# Patient Record
Sex: Male | Born: 1985 | Race: Black or African American | Hispanic: No | Marital: Single | Smoking: Current every day smoker
Health system: Southern US, Community
[De-identification: ages and names within clinical notes are randomized; demographics above are authoritative.]

## PROBLEM LIST (undated history)

## (undated) DIAGNOSIS — F39 Unspecified mood [affective] disorder: Secondary | ICD-10-CM

## (undated) HISTORY — DX: Unspecified mood (affective) disorder: F39

## (undated) HISTORY — PX: TYMPANOSTOMY TUBE PLACEMENT: SHX32

---
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
## 2006-01-12 ENCOUNTER — Emergency Department (HOSPITAL_COMMUNITY): Admission: EM | Admit: 2006-01-12 | Discharge: 2006-01-12 | Payer: Self-pay | Admitting: Emergency Medicine

## 2006-08-11 ENCOUNTER — Emergency Department (HOSPITAL_COMMUNITY): Admission: EM | Admit: 2006-08-11 | Discharge: 2006-08-11 | Payer: Self-pay | Admitting: Emergency Medicine

## 2015-09-17 ENCOUNTER — Other Ambulatory Visit: Payer: Self-pay | Admitting: Physician Assistant

## 2015-09-17 MED ORDER — CLINDAMYCIN HCL 300 MG PO CAPS
300.0000 mg | ORAL_CAPSULE | Freq: Three times a day (TID) | ORAL | Status: AC
Start: 2015-09-17 — End: 2015-09-23

## 2016-05-27 ENCOUNTER — Ambulatory Visit: Payer: Self-pay | Admitting: Physician Assistant

## 2016-06-02 ENCOUNTER — Encounter: Payer: Self-pay | Admitting: Physician Assistant

## 2016-09-23 ENCOUNTER — Ambulatory Visit: Payer: Self-pay | Admitting: Physician Assistant

## 2016-09-23 ENCOUNTER — Encounter: Payer: Self-pay | Admitting: Physician Assistant

## 2016-09-23 VITALS — BP 122/72 | HR 84 | Temp 97.9°F | Ht 70.5 in | Wt 251.2 lb

## 2016-09-23 DIAGNOSIS — B353 Tinea pedis: Secondary | ICD-10-CM

## 2016-09-23 DIAGNOSIS — M79645 Pain in left finger(s): Secondary | ICD-10-CM

## 2016-09-23 DIAGNOSIS — Z131 Encounter for screening for diabetes mellitus: Secondary | ICD-10-CM

## 2016-09-23 DIAGNOSIS — F17210 Nicotine dependence, cigarettes, uncomplicated: Secondary | ICD-10-CM | POA: Insufficient documentation

## 2016-09-23 DIAGNOSIS — R21 Rash and other nonspecific skin eruption: Secondary | ICD-10-CM

## 2016-09-23 LAB — GLUCOSE, POCT (MANUAL RESULT ENTRY): POC Glucose: 86 mg/dl (ref 70–99)

## 2016-09-23 MED ORDER — TRIAMCINOLONE ACETONIDE 0.1 % EX CREA: 1.0000 "application " | TOPICAL_CREAM | Freq: Two times a day (BID) | CUTANEOUS | 0 refills | Status: DC

## 2016-09-23 MED ORDER — NYSTATIN 100000 UNIT/GM EX CREA: 1.0000 "application " | TOPICAL_CREAM | Freq: Two times a day (BID) | CUTANEOUS | 0 refills | Status: DC

## 2016-09-23 NOTE — Progress Notes (Signed)
BP 122/72 (BP Location: Left Arm, Patient Position: Sitting, Cuff Size: Normal)   Pulse 84   Temp 97.9 F (36.6 C)   Ht 5' 10.5" (1.791 m)   Wt 251 lb 4 oz (114 kg)   SpO2 97%   BMI 35.54 kg/m    Subjective:    Patient ID: Scott Atkins, male    DOB: 06-20-1986, 31 y.o.   MRN: [id number]  HPI: Scott Atkins is a 31 y.o. male presenting on 09/23/2016 for New Patient (Initial Visit) (returning patient. pt is currently being seen at [facility]. Pt was previously being seen with a doctor in [place] pt is unsure of the name of the practice.)   HPI Chief Complaint  Patient presents with  . New Patient (Initial Visit)    returning patient. pt is currently being seen at [facility]. Pt was previously being seen with a doctor in [place] pt is unsure of the name of the practice.    Pt was Last seen here 05/29/2015  Pt is still going to [facility] for MH   Pt c/o rash on back.  C/o L index finger swells.  Pt is right hand dominant.  Also pt c/o both feet swelling  Rash for about 2 wk.  Says it itches.  He used some cream on it that helps with the itching.  Says rash limited to B flank  Labs 04/03/2015- cbcd nl, cmp normal, lipid hdl 37 o/w normal, tsh nl, a1c 5.0  L inder finger pain x 2 wk.  Denies injury  Pt states feet swell "when they feel like it".  No injury  Relevant past medical, surgical, family and social history reviewed and updated as indicated. Interim medical history since our last visit reviewed. Allergies and medications reviewed and updated.  Current Outpatient Prescriptions:  .  LORazepam (ATIVAN) 0.5 MG tablet, Take 0.5 mg by mouth 2 (two) times daily as needed for anxiety., Disp: , Rfl:  .  perphenazine (TRILAFON) 4 MG tablet, Take 4 mg by mouth at bedtime., Disp: , Rfl:    Review of Systems  Constitutional: Positive for fatigue. Negative for appetite change, chills, diaphoresis, fever and unexpected weight change.  HENT: Positive for congestion  and dental problem. Negative for drooling, ear pain, facial swelling, hearing loss, mouth sores, sneezing, sore throat, trouble swallowing and voice change.   Eyes: Negative for pain, discharge, redness, itching and visual disturbance.  Respiratory: Positive for cough and shortness of breath. Negative for choking and wheezing.   Cardiovascular: Negative for chest pain, palpitations and leg swelling.  Gastrointestinal: Positive for constipation. Negative for abdominal pain, blood in stool, diarrhea and vomiting.  Endocrine: Negative for cold intolerance, heat intolerance and polydipsia.  Genitourinary: Negative for decreased urine volume, dysuria and hematuria.  Musculoskeletal: Positive for arthralgias. Negative for back pain and gait problem.  Skin: Positive for rash.  Allergic/Immunologic: Negative for environmental allergies.  Neurological: Negative for seizures, syncope, light-headedness and headaches.  Hematological: Negative for adenopathy.  Psychiatric/Behavioral: Positive for agitation. Negative for dysphoric mood and suicidal ideas. The patient is nervous/anxious.     Per HPI unless specifically indicated above     Objective:    BP 122/72 (BP Location: Left Arm, Patient Position: Sitting, Cuff Size: Normal)   Pulse 84   Temp 97.9 F (36.6 C)   Ht 5' 10.5" (1.791 m)   Wt 251 lb 4 oz (114 kg)   SpO2 97%   BMI 35.54 kg/m  Wt Readings from Last 3 Encounters:  09/23/16 251 lb 4 oz (114 kg)    Physical Exam  Constitutional: He is oriented to person, place, and time. He appears well-developed and well-nourished.  HENT:  Head: Normocephalic and atraumatic.  Mouth/Throat: Oropharynx is clear and moist. No oropharyngeal exudate.  Eyes: Conjunctivae and EOM are normal. Pupils are equal, round, and reactive to light.  Neck: Neck supple. No thyromegaly present.  Cardiovascular: Normal rate and regular rhythm.   Pulmonary/Chest: Effort normal and breath sounds normal. He has no  wheezes. He has no rales.  Abdominal: Soft. Bowel sounds are normal. He exhibits no mass. There is no hepatosplenomegaly. There is no tenderness.  Musculoskeletal: He exhibits no edema.       Left hand: He exhibits normal range of motion, no tenderness, normal capillary refill and no deformity.       Right foot: Normal. There is no tenderness and no swelling.       Left foot: Normal. There is no tenderness and no swelling.  L index finger with pain on flexion of PIP.  No pain with flexion of DIP.  Entire finger is nontender to palpation.  No swelling or other abnormality  Lymphadenopathy:    He has no cervical adenopathy.  Neurological: He is alert and oriented to person, place, and time.  Skin: Skin is warm and dry. Rash noted.     No changes in pigmentation.  Barely visible papules flank area bilaterally where rubs with pants.  No rash elsewhere on back or torso.  Skin on feet is thickened and peeling on plantar surfaces bilaterally and into webspaces.  No maceration.    Psychiatric: He has a normal mood and affect. His behavior is normal. Thought content normal.  Vitals reviewed.       Results for orders placed or performed in visit on 09/23/16  POCT Glucose (CBG)  Result Value Ref Range   POC Glucose 86 70 - 99 mg/dl      Assessment & Plan:   Encounter Diagnoses  Name Primary?  . Rash and nonspecific skin eruption Yes  . Pain of finger of left hand   . Tinea pedis of both feet   . Cigarette nicotine dependence without complication   . Screening for diabetes mellitus (DM)      -Rx TAC for rash  -Nystatin cream for feet. vaseline at night. Counseled on keeping feet dry, changing socks frequently  -IBU for finger pain.  Discussed with pt appears to be likely he mildly injured it.  Should resolve without more aggressive treatment  -pt to continue with [facility] for MH care  -F/u 1 mo recheck. No labs needed

## 2016-09-23 NOTE — Patient Instructions (Signed)
Athlete's Foot Introduction Athlete's foot (tinea pedis) is a fungal infection of the skin on the feet. It often occurs on the skin that is between or underneath the toes. It can also occur on the soles of the feet. The infection can spread from person to person (is contagious). What are the causes? Athlete's foot is caused by a fungus. This fungus grows in warm, moist places. Most people get athlete's foot by sharing shower stalls, towels, and wet floors with someone who is infected. Not washing your feet or changing your socks often enough can contribute to athlete's foot. What increases the risk? This condition is more likely to develop in:  Men.  People who have a weak body defense system (immune system).  People who have diabetes.  People who use public showers, such as at a gym.  People who wear heavy-duty shoes, such as industrial or military shoes.  Seasons with warm, humid weather. What are the signs or symptoms? Symptoms of this condition include:  Itchy areas between the toes or on the soles of the feet.  White, flaky, or scaly areas between the toes or on the soles of the feet.  Very itchy small blisters between the toes or on the soles of the feet.  Small cuts on the skin. These cuts can become infected.  Thick or discolored toenails. How is this diagnosed? This condition is diagnosed with a medical history and physical exam. Your health care provider may also take a skin or toenail sample to be examined. How is this treated? Treatment for this condition includes antifungal medicines. These may be applied as powders, ointments, or creams. In severe cases, an oral antifungal medicine may be given. Follow these instructions at home:  Apply or take over-the-counter and prescription medicines only as told by your health care provider.  Keep all follow-up visits as told by your health care provider. This is important.  Do not scratch your feet.  Keep your feet  dry:  Wear cotton or wool socks. Change your socks every day or if they become wet.  Wear shoes that allow air to circulate, such as sandals or canvas tennis shoes.  Wash and dry your feet:  Every day or as told by your health care provider.  After exercising.  Including the area between your toes.  Do not share towels, nail clippers, or other personal items that touch your feet with others.  If you have diabetes, keep your blood sugar under control. How is this prevented?  Do not share towels.  Wear sandals in wet areas, such as locker rooms and shared showers.  Keep your feet dry:  Wear cotton or wool socks. Change your socks every day or if they become wet.  Wear shoes that allow air to circulate, such as sandals or canvas tennis shoes.  Wash and dry your feet after exercising. Pay attention to the area between your toes. Contact a health care provider if:  You have a fever.  You have swelling, soreness, warmth, or redness in your foot.  You are not getting better with treatment.  Your symptoms get worse.  You have new symptoms. This information is not intended to replace advice given to you by your health care provider. Make sure you discuss any questions you have with your health care provider. Document Released: 08/13/2000 Document Revised: 01/22/2016 Document Reviewed: 02/17/2015  2017 Elsevier  

## 2016-09-29 ENCOUNTER — Ambulatory Visit: Payer: Self-pay | Admitting: Physician Assistant

## 2016-09-30 ENCOUNTER — Encounter: Payer: Self-pay | Admitting: Physician Assistant

## 2016-10-14 ENCOUNTER — Encounter: Payer: Self-pay | Admitting: Physician Assistant

## 2016-10-14 ENCOUNTER — Ambulatory Visit: Payer: Self-pay | Admitting: Physician Assistant

## 2016-10-14 VITALS — BP 110/72 | HR 65 | Temp 97.9°F | Ht 70.5 in | Wt 253.0 lb

## 2016-10-14 DIAGNOSIS — K0889 Other specified disorders of teeth and supporting structures: Secondary | ICD-10-CM

## 2016-10-14 DIAGNOSIS — F17210 Nicotine dependence, cigarettes, uncomplicated: Secondary | ICD-10-CM

## 2016-10-14 DIAGNOSIS — R21 Rash and other nonspecific skin eruption: Secondary | ICD-10-CM

## 2016-10-14 DIAGNOSIS — B353 Tinea pedis: Secondary | ICD-10-CM

## 2016-10-14 MED ORDER — CLINDAMYCIN HCL 300 MG PO CAPS: 300.0000 mg | ORAL_CAPSULE | Freq: Three times a day (TID) | ORAL | 0 refills | Status: DC

## 2016-10-14 NOTE — Progress Notes (Signed)
BP 110/72 (BP Location: Left Arm, Patient Position: Sitting, Cuff Size: Large)   Pulse 65   Temp 97.9 F (36.6 C) (Other (Comment))   Ht 5' 10.5" (1.791 m)   Wt 253 lb (114.8 kg)   SpO2 96%   BMI 35.79 kg/m    Subjective:    Patient ID: Scott Atkins, male    DOB: April 16, 1986, 31 y.o.   MRN: [id number]  HPI: Scott Atkins is a 31 y.o. male presenting on 10/14/2016 for Rash (c/o rash, that itches, to back for about a month; also has abscess in mouth)   HPI  Chief Complaint  Patient presents with  . Rash    c/o rash, that itches, to back for about a month; also has abscess in mouth   Pt has not gotten either Rx filled that he was given at [facility] 1 month ago Pt says he got a pedicure and he has been using vaseline on his feet  Relevant past medical, surgical, family and social history reviewed and updated as indicated. Interim medical history since our last visit reviewed. Allergies and medications reviewed and updated.  CURRENT MEDS: Ativan trilafon  Review of Systems  Constitutional: Negative for appetite change, chills, diaphoresis, fatigue, fever and unexpected weight change.  HENT: Positive for dental problem. Negative for congestion, drooling, ear pain, facial swelling, hearing loss, mouth sores, sneezing, sore throat, trouble swallowing and voice change.   Eyes: Negative for pain, discharge, redness, itching and visual disturbance.  Respiratory: Negative for cough, choking, shortness of breath and wheezing.   Cardiovascular: Negative for chest pain, palpitations and leg swelling.  Gastrointestinal: Negative for abdominal pain, blood in stool, constipation, diarrhea and vomiting.  Endocrine: Negative for cold intolerance, heat intolerance and polydipsia.  Genitourinary: Negative for decreased urine volume, dysuria and hematuria.  Musculoskeletal: Negative for arthralgias, back pain and gait problem.  Skin: Positive for rash.  Allergic/Immunologic: Negative for  environmental allergies.  Neurological: Negative for seizures, syncope, light-headedness and headaches.  Hematological: Negative for adenopathy.  Psychiatric/Behavioral: Negative for agitation, dysphoric mood and suicidal ideas. The patient is not nervous/anxious.     Per HPI unless specifically indicated above     Objective:    BP 110/72 (BP Location: Left Arm, Patient Position: Sitting, Cuff Size: Large)   Pulse 65   Temp 97.9 F (36.6 C) (Other (Comment))   Ht 5' 10.5" (1.791 m)   Wt 253 lb (114.8 kg)   SpO2 96%   BMI 35.79 kg/m   Wt Readings from Last 3 Encounters:  10/14/16 253 lb (114.8 kg)  09/23/16 251 lb 4 oz (114 kg)    Physical Exam  Constitutional: He is oriented to person, place, and time. He appears well-developed and well-nourished.  HENT:  Head: Normocephalic and atraumatic.  Mouth/Throat: Uvula is midline and oropharynx is clear and moist. Abnormal dentition. Dental caries present. No dental abscesses or uvula swelling.    Pulmonary/Chest: Effort normal.  Neurological: He is alert and oriented to person, place, and time.  Skin: Skin is warm and dry.     Rash R flank.  No rash L flank/resolved. Feet improved but still very dry and cracking, esp webspaces and heels.  No maceration  Psychiatric: He has a normal mood and affect.  Nursing note and vitals reviewed.      Assessment & Plan:    Encounter Diagnoses  Name Primary?  . Rash and nonspecific skin eruption Yes  . Tinea pedis of both feet   .  Cigarette nicotine dependence without complication   . Dentalgia     Get rx filled (TAC and nystatin) rx clindamycin for tooth -follow up one month to recheck rash, feet

## 2016-10-21 ENCOUNTER — Ambulatory Visit: Payer: Self-pay | Admitting: Physician Assistant

## 2016-11-11 ENCOUNTER — Encounter: Payer: Self-pay | Admitting: Physician Assistant

## 2016-11-11 ENCOUNTER — Ambulatory Visit: Payer: Self-pay | Admitting: Physician Assistant

## 2016-11-11 VITALS — BP 114/60 | HR 61 | Temp 98.1°F | Wt 255.5 lb

## 2016-11-11 DIAGNOSIS — F17210 Nicotine dependence, cigarettes, uncomplicated: Secondary | ICD-10-CM

## 2016-11-11 DIAGNOSIS — R21 Rash and other nonspecific skin eruption: Secondary | ICD-10-CM

## 2016-11-11 DIAGNOSIS — B353 Tinea pedis: Secondary | ICD-10-CM

## 2016-11-11 NOTE — Progress Notes (Signed)
BP 114/60 (BP Location: Left Arm, Patient Position: Sitting, Cuff Size: Normal)   Pulse 61   Temp 98.1 F (36.7 C)   Wt 255 lb 8 oz (115.9 kg)   SpO2 97%   BMI 36.14 kg/m    Subjective:    Patient ID: Scott Atkins, male    DOB: 07-11-1986, 31 y.o.   MRN: [id number]  HPI: Scott Atkins is a 31 y.o. male presenting on 11/11/2016 for Rash   HPI Pt has been using cream and says rash is improved  Relevant past medical, surgical, family and social history reviewed and updated as indicated. Interim medical history since our last visit reviewed. Allergies and medications reviewed and updated.   Current Outpatient Prescriptions:  .  LORazepam (ATIVAN) 0.5 MG tablet, Take 0.5 mg by mouth 2 (two) times daily as needed for anxiety., Disp: , Rfl:  .  nystatin cream (MYCOSTATIN), Apply 1 application topically 2 (two) times daily., Disp: 30 g, Rfl: 0 .  perphenazine (TRILAFON) 4 MG tablet, Take 4 mg by mouth at bedtime., Disp: , Rfl:  .  triamcinolone cream (KENALOG) 0.1 %, Apply 1 application topically 2 (two) times daily., Disp: 30 g, Rfl: 0 .  clindamycin (CLEOCIN) 300 MG capsule, Take 1 capsule (300 mg total) by mouth 3 (three) times daily. (Patient not taking: Reported on 11/11/2016), Disp: 21 capsule, Rfl: 0   Review of Systems  Constitutional: Negative for appetite change, chills, diaphoresis, fatigue, fever and unexpected weight change.  HENT: Positive for dental problem. Negative for congestion, drooling, ear pain, facial swelling, hearing loss, mouth sores, sneezing, sore throat, trouble swallowing and voice change.   Eyes: Negative for pain, discharge, redness, itching and visual disturbance.  Respiratory: Negative for cough, choking, shortness of breath and wheezing.   Cardiovascular: Negative for chest pain, palpitations and leg swelling.  Gastrointestinal: Negative for abdominal pain, blood in stool, constipation, diarrhea and vomiting.  Endocrine: Negative for cold intolerance,  heat intolerance and polydipsia.  Genitourinary: Negative for decreased urine volume, dysuria and hematuria.  Musculoskeletal: Negative for arthralgias, back pain and gait problem.  Skin: Negative for rash.  Allergic/Immunologic: Negative for environmental allergies.  Neurological: Negative for seizures, syncope, light-headedness and headaches.  Hematological: Negative for adenopathy.  Psychiatric/Behavioral: Positive for agitation and dysphoric mood. Negative for suicidal ideas. The patient is not nervous/anxious.     Per HPI unless specifically indicated above     Objective:    BP 114/60 (BP Location: Left Arm, Patient Position: Sitting, Cuff Size: Normal)   Pulse 61   Temp 98.1 F (36.7 C)   Wt 255 lb 8 oz (115.9 kg)   SpO2 97%   BMI 36.14 kg/m   Wt Readings from Last 3 Encounters:  11/11/16 255 lb 8 oz (115.9 kg)  10/14/16 253 lb (114.8 kg)  09/23/16 251 lb 4 oz (114 kg)    Physical Exam  Constitutional: He is oriented to person, place, and time. He appears well-developed and well-nourished.  HENT:  Head: Normocephalic and atraumatic.  Pulmonary/Chest: Effort normal.  Neurological: He is oriented to person, place, and time.  Skin: Skin is warm and dry.  Rash on torso resolved.  Feet much improved with still a bit of dry cracking skin.  No maceration  Psychiatric: He has a normal mood and affect. His behavior is normal.  Nursing note and vitals reviewed.   Results for orders placed or performed in visit on 09/23/16  POCT Glucose (CBG)  Result Value Ref Range  POC Glucose 86 70 - 99 mg/dl      Assessment & Plan:    Encounter Diagnoses  Name Primary?  . Tinea pedis of both feet Yes  . Rash and nonspecific skin eruption   . Cigarette nicotine dependence without complication     -pt again counseled on helping prevent tinea pedis, especially need to avoid wearing wet socks -follow up one year.  RTO sooner prn

## 2017-06-20 ENCOUNTER — Ambulatory Visit: Payer: Self-pay | Admitting: Physician Assistant

## 2017-09-15 ENCOUNTER — Encounter: Payer: Self-pay | Admitting: Physician Assistant

## 2017-09-15 ENCOUNTER — Ambulatory Visit: Payer: Self-pay | Admitting: Physician Assistant

## 2017-09-15 VITALS — BP 122/70 | HR 68 | Temp 97.9°F | Ht 70.5 in | Wt 254.2 lb

## 2017-09-15 DIAGNOSIS — L02213 Cutaneous abscess of chest wall: Secondary | ICD-10-CM

## 2017-09-15 MED ORDER — NYSTATIN 100000 UNIT/GM EX CREA: 1.0000 "application " | TOPICAL_CREAM | Freq: Two times a day (BID) | CUTANEOUS | 0 refills | Status: DC

## 2017-09-15 MED ORDER — SULFAMETHOXAZOLE-TRIMETHOPRIM 800-160 MG PO TABS: 1.0000 | ORAL_TABLET | Freq: Two times a day (BID) | ORAL | 0 refills | Status: DC

## 2017-09-15 NOTE — Progress Notes (Signed)
BP 122/70 (BP Location: Left Arm, Patient Position: Sitting, Cuff Size: Normal)   Pulse 68   Temp 97.9 F (36.6 C)   Ht 5' 10.5" (1.791 m)   Wt 254 lb 4 oz (115.3 kg)   SpO2 97%   BMI 35.97 kg/m    Subjective:    Patient ID: Scott Atkins, male    DOB: 04/10/1986, 32 y.o.   MRN: [id number]  HPI: Scott Atkins is a 32 y.o. male presenting on 09/15/2017 for Rash (itchy sometimes. right hip appeared 3 1/2 nights ago. ) and Recurrent Skin Infections (for about a week on upper mid abd. pt states he keeps trying to "bust it, but it won't bust")   HPI  Pt complains of abscess on anterior chest wall which started about a week ago.  He also requests refill of cream that he was given in the past.   Relevant past medical, surgical, family and social history reviewed and updated as indicated. Interim medical history since our last visit reviewed. Allergies and medications reviewed and updated.  CURRENT MEDS: Ativan Trilafon  Review of Systems  Constitutional: Negative for appetite change, chills, diaphoresis, fatigue, fever and unexpected weight change.  HENT: Negative for congestion, dental problem, drooling, ear pain, facial swelling, hearing loss, mouth sores, sneezing, sore throat, trouble swallowing and voice change.   Eyes: Negative for pain, discharge, redness, itching and visual disturbance.  Respiratory: Negative for cough, choking, shortness of breath and wheezing.   Cardiovascular: Negative for chest pain, palpitations and leg swelling.  Gastrointestinal: Negative for abdominal pain, blood in stool, constipation, diarrhea and vomiting.  Endocrine: Negative for cold intolerance, heat intolerance and polydipsia.  Genitourinary: Negative for decreased urine volume, dysuria and hematuria.  Musculoskeletal: Negative for arthralgias, back pain and gait problem.  Skin: Positive for rash.  Allergic/Immunologic: Negative for environmental allergies.  Neurological: Negative for  seizures, syncope, light-headedness and headaches.  Hematological: Negative for adenopathy.  Psychiatric/Behavioral: Negative for agitation, dysphoric mood and suicidal ideas. The patient is not nervous/anxious.     Per HPI unless specifically indicated above     Objective:    BP 122/70 (BP Location: Left Arm, Patient Position: Sitting, Cuff Size: Normal)   Pulse 68   Temp 97.9 F (36.6 C)   Ht 5' 10.5" (1.791 m)   Wt 254 lb 4 oz (115.3 kg)   SpO2 97%   BMI 35.97 kg/m   Wt Readings from Last 3 Encounters:  09/15/17 254 lb 4 oz (115.3 kg)  11/11/16 255 lb 8 oz (115.9 kg)  10/14/16 253 lb (114.8 kg)    Physical Exam  Constitutional: He is oriented to person, place, and time. He appears well-developed and well-nourished.  HENT:  Head: Normocephalic and atraumatic.  Pulmonary/Chest: Effort normal. No respiratory distress.  Neurological: He is oriented to person, place, and time.  Skin: Skin is warm and dry.     1 inch abscess over lower sternal area.  It is fluctuant and tender.  Psychiatric: He has a normal mood and affect. His behavior is normal.  Nursing note and vitals reviewed.  After discussing risks and benefits, pt signed informed consent for I&D.  The area was draped and cleaned with hibiclens.  Lidocain with epi was instilled and the abscess was opened with #11 blade.  Moderate amount purulent material expresses.  loculations broken up.  Wound packed with sterile guaze.  Area dressed with dry sterile dressing.  Pt tolerated procedure well and ambulated from room without assistance.  Assessment & Plan:   Encounter Diagnosis  Name Primary?  . Cutaneous abscess of chest wall Yes    -pt to apply warm compresses.  He is to leave the packing in place until Saturday (2 days) and then remove.  He is to change the dressing every day.  rx septra ds.   -nystatin cream refilled per pt request -pt to follow up in march as scheduled.  He is to RTO sooner if his abscess  doesn't resolve or if he has other problems.

## 2017-10-04 ENCOUNTER — Other Ambulatory Visit: Payer: Self-pay | Admitting: Physician Assistant

## 2017-10-04 MED ORDER — NYSTATIN 100000 UNIT/GM EX CREA: 1.0000 "application " | TOPICAL_CREAM | Freq: Two times a day (BID) | CUTANEOUS | 0 refills | Status: DC

## 2017-10-24 ENCOUNTER — Ambulatory Visit: Payer: Self-pay | Admitting: Physician Assistant

## 2017-10-31 ENCOUNTER — Encounter: Payer: Self-pay | Admitting: Physician Assistant

## 2017-11-10 ENCOUNTER — Ambulatory Visit: Payer: Self-pay | Admitting: Physician Assistant

## 2017-11-22 ENCOUNTER — Encounter: Payer: Self-pay | Admitting: Physician Assistant

## 2017-11-22 ENCOUNTER — Ambulatory Visit: Payer: Self-pay | Admitting: Physician Assistant

## 2017-11-22 VITALS — BP 130/74 | HR 80 | Temp 97.5°F | Ht 70.5 in | Wt 252.0 lb

## 2017-11-22 DIAGNOSIS — Z13220 Encounter for screening for lipoid disorders: Secondary | ICD-10-CM

## 2017-11-22 DIAGNOSIS — F17210 Nicotine dependence, cigarettes, uncomplicated: Secondary | ICD-10-CM

## 2017-11-22 DIAGNOSIS — L309 Dermatitis, unspecified: Secondary | ICD-10-CM

## 2017-11-22 MED ORDER — TRIAMCINOLONE ACETONIDE 0.1 % EX CREA: 1.0000 "application " | TOPICAL_CREAM | Freq: Two times a day (BID) | CUTANEOUS | 1 refills | Status: DC | PRN

## 2017-11-22 NOTE — Patient Instructions (Signed)

## 2017-11-22 NOTE — Progress Notes (Signed)
BP 130/74 (BP Location: Right Arm, Patient Position: Sitting, Cuff Size: Normal)   Pulse 80   Temp (!) 97.5 F (36.4 C)   Ht 5' 10.5" (1.791 m)   Wt 252 lb (114.3 kg)   SpO2 95%   BMI 35.65 kg/m    Subjective:    Patient ID: Scott Atkins, male    DOB: 10-22-1985, 32 y.o.   MRN: [id number]  HPI: Scott Atkins is a 32 y.o. male presenting on 11/22/2017 for Follow-up   HPI  Pt is about to start at a new place for mental health.  [facility]  Pt states rash never goes away.  It waxes and wanes.  Exam today shows no rash and pt admit that it does sometimes go away but then it always returns.     Relevant past medical, surgical, family and social history reviewed and updated as indicated. Interim medical history since our last visit reviewed. Allergies and medications reviewed and updated.   Current Outpatient Medications:  .  LORazepam (ATIVAN) 0.5 MG tablet, Take 0.5 mg by mouth 2 (two) times daily as needed for anxiety., Disp: , Rfl:  .  perphenazine (TRILAFON) 4 MG tablet, Take 4 mg by mouth at bedtime., Disp: , Rfl:   Review of Systems  Constitutional: Negative for appetite change, chills, diaphoresis, fatigue, fever and unexpected weight change.  HENT: Positive for dental problem and facial swelling. Negative for congestion, drooling, ear pain, hearing loss, mouth sores, sneezing, sore throat, trouble swallowing and voice change.   Eyes: Negative for pain, discharge, redness, itching and visual disturbance.  Respiratory: Negative for cough, choking, shortness of breath and wheezing.   Cardiovascular: Negative for chest pain, palpitations and leg swelling.  Gastrointestinal: Negative for abdominal pain, blood in stool, constipation, diarrhea and vomiting.  Endocrine: Negative for cold intolerance, heat intolerance and polydipsia.  Genitourinary: Positive for decreased urine volume. Negative for dysuria and hematuria.  Musculoskeletal: Positive for  arthralgias and back pain. Negative for gait problem.  Skin: Positive for rash.  Allergic/Immunologic: Positive for environmental allergies.  Neurological: Negative for seizures, syncope, light-headedness and headaches.  Hematological: Negative for adenopathy.  Psychiatric/Behavioral: Positive for agitation. Negative for dysphoric mood and suicidal ideas. The patient is nervous/anxious.     Per HPI unless specifically indicated above     Objective:    BP 130/74 (BP Location: Right Arm, Patient Position: Sitting, Cuff Size: Normal)   Pulse 80   Temp (!) 97.5 F (36.4 C)   Ht 5' 10.5" (1.791 m)   Wt 252 lb (114.3 kg)   SpO2 95%   BMI 35.65 kg/m   Wt Readings from Last 3 Encounters:  11/22/17 252 lb (114.3 kg)  09/15/17 254 lb 4 oz (115.3 kg)  11/11/16 255 lb 8 oz (115.9 kg)    Physical Exam  Constitutional: He is oriented to person, place, and time. He appears well-developed and well-nourished.  HENT:  Head: Normocephalic and atraumatic.  No facial swelling seen  Neck: Neck supple.  Cardiovascular: Normal rate and regular rhythm.  Pulmonary/Chest: Effort normal and breath sounds normal. He has no wheezes.  Abdominal: Soft. Bowel sounds are normal. There is no hepatosplenomegaly. There is no tenderness.  Musculoskeletal: He exhibits no edema.  Lymphadenopathy:    He has no cervical adenopathy (no enlarged nodes palpable).  Neurological: He is alert and oriented to person, place, and time.  Skin: Skin is warm and dry. No rash noted.  Psychiatric: He  has a normal mood and affect. His behavior is normal.  Vitals reviewed.       Assessment & Plan:   Encounter Diagnoses  Name Primary?  . Eczema, unspecified type Yes  . Screening cholesterol level   . Cigarette nicotine dependence without complication     -discussed with pt and his mother about the chronic nature of eczema -will Check cholesterol.   -counseled smoking cessation -pt to continue with [facility] as  scheduled -pt to follow up 1 year.  RTO sooner prn

## 2017-12-05 ENCOUNTER — Other Ambulatory Visit (HOSPITAL_COMMUNITY)
Admission: RE | Admit: 2017-12-05 | Discharge: 2017-12-05 | Disposition: A | Discharge status: Home / Self Care | Payer: Self-pay | Source: Ambulatory Visit | Attending: Physician Assistant | Admitting: Physician Assistant

## 2017-12-05 DIAGNOSIS — Z13220 Encounter for screening for lipoid disorders: Secondary | ICD-10-CM | POA: Insufficient documentation

## 2017-12-05 LAB — COMPREHENSIVE METABOLIC PANEL
ALT: 20 U/L (ref 17–63)
AST: 20 U/L (ref 15–41)
Albumin: 4.2 g/dL (ref 3.5–5.0)
Alkaline Phosphatase: 73 U/L (ref 38–126)
Anion gap: 10 (ref 5–15)
BUN: 8 mg/dL (ref 6–20)
CHLORIDE: 105 mmol/L (ref 101–111)
CO2: 28 mmol/L (ref 22–32)
Calcium: 9.7 mg/dL (ref 8.9–10.3)
Creatinine, Ser: 1 mg/dL (ref 0.61–1.24)
Glucose, Bld: 92 mg/dL (ref 65–99)
Potassium: 4.1 mmol/L (ref 3.5–5.1)
Sodium: 143 mmol/L (ref 135–145)
Total Bilirubin: 0.9 mg/dL (ref 0.3–1.2)
Total Protein: 7.9 g/dL (ref 6.5–8.1)

## 2017-12-05 LAB — LIPID PANEL
Cholesterol: 160 mg/dL (ref 0–200)
HDL: 38 mg/dL — AB (ref >40)
LDL CALC: 113 mg/dL — AB (ref 0–99)
TRIGLYCERIDES: 45 mg/dL (ref <150)
Total CHOL/HDL Ratio: 4.2 RATIO
VLDL: 9 mg/dL (ref 0–40)

## 2018-01-05 ENCOUNTER — Ambulatory Visit (HOSPITAL_COMMUNITY)
Admission: RE | Admit: 2018-01-05 | Discharge: 2018-01-05 | Disposition: A | Discharge status: Home / Self Care | Payer: Self-pay | Source: Ambulatory Visit | Attending: Physician Assistant | Admitting: Physician Assistant

## 2018-01-05 ENCOUNTER — Encounter: Payer: Self-pay | Admitting: Physician Assistant

## 2018-01-05 ENCOUNTER — Ambulatory Visit: Payer: Self-pay | Admitting: Physician Assistant

## 2018-01-05 VITALS — BP 110/76 | HR 60 | Temp 98.1°F | Wt 244.5 lb

## 2018-01-05 DIAGNOSIS — M7989 Other specified soft tissue disorders: Secondary | ICD-10-CM | POA: Insufficient documentation

## 2018-01-05 DIAGNOSIS — M79641 Pain in right hand: Secondary | ICD-10-CM

## 2018-01-05 MED ORDER — NAPROXEN 500 MG PO TABS: 500.0000 mg | ORAL_TABLET | Freq: Two times a day (BID) | ORAL | 1 refills | Status: DC

## 2018-01-05 NOTE — Progress Notes (Signed)
BP 110/76 (BP Location: Left Arm, Patient Position: Sitting, Cuff Size: Normal)   Pulse 60   Temp 98.1 F (36.7 C)   Wt 244 lb 8 oz (110.9 kg)   SpO2 97%   BMI 34.59 kg/m    Subjective:    Patient ID: Scott Atkins, male    DOB: 09-24-1985, 32 y.o.   MRN: [id number]  HPI: Scott Atkins is a 32 y.o. male presenting on 01/05/2018 for Hand Pain (right index and left middle. for about a month and a half. pt has trouble bending fingers.) and Dental Problem (pt states he feels a buble on gum. pt states it was bigger when he was smoking but has gone down since he cut back.Marland Kitchen)   HPI Chief Complaint  Patient presents with  . Hand Pain    right index and left middle. for about a month and a half. pt has trouble bending fingers.  . Dental Problem    pt states he feels a buble on gum. pt states it was bigger when he was smoking but has gone down since he cut back..     Pt says R hand swollen for about 1 1/2 mo.  He says he just woke up like the.  He says it is getting worse and harder for it to move.  No injury. Pt is R hand dominant.  Pt only electronics is his phone which he doesn't use his 3rd finger for.  He also says he has some pain L hand near index finger/mcp joint, more on the palmar side.   Pt states gum bubble is not a big deal.  It doesn't hurt.    Relevant past medical, surgical, family and social history reviewed and updated as indicated. Interim medical history since our last visit reviewed. Allergies and medications reviewed and updated.   Current Outpatient Medications:  .  LORazepam (ATIVAN) 0.5 MG tablet, Take 0.5 mg by mouth 2 (two) times daily as needed for anxiety., Disp: , Rfl:  .  perphenazine (TRILAFON) 4 MG tablet, Take 4 mg by mouth at bedtime., Disp: , Rfl:  .  triamcinolone cream (KENALOG) 0.1 %, Apply 1 application topically 2 (two) times daily as needed., Disp: 30 g, Rfl: 1  Review of Systems  Constitutional: Negative for appetite change, chills,  diaphoresis, fatigue, fever and unexpected weight change.  HENT: Negative for congestion, dental problem, drooling, ear pain, facial swelling, hearing loss, mouth sores, sneezing, sore throat, trouble swallowing and voice change.   Eyes: Negative for pain, discharge, redness, itching and visual disturbance.  Respiratory: Negative for cough, choking, shortness of breath and wheezing.   Cardiovascular: Negative for chest pain, palpitations and leg swelling.  Gastrointestinal: Negative for abdominal pain, blood in stool, constipation, diarrhea and vomiting.  Endocrine: Negative for cold intolerance, heat intolerance and polydipsia.  Genitourinary: Negative for decreased urine volume, dysuria and hematuria.  Musculoskeletal: Negative for arthralgias, back pain and gait problem.  Skin: Negative for rash.  Allergic/Immunologic: Negative for environmental allergies.  Neurological: Negative for seizures, syncope, light-headedness and headaches.  Hematological: Negative for adenopathy.  Psychiatric/Behavioral: Negative for agitation, dysphoric mood and suicidal ideas. The patient is not nervous/anxious.     Per HPI unless specifically indicated above     Objective:    BP 110/76 (BP Location: Left Arm, Patient Position: Sitting, Cuff Size: Normal)   Pulse 60   Temp 98.1 F (36.7 C)   Wt 244 lb 8 oz (110.9 kg)   SpO2 97%  BMI 34.59 kg/m   Wt Readings from Last 3 Encounters:  01/05/18 244 lb 8 oz (110.9 kg)  11/22/17 252 lb (114.3 kg)  09/15/17 254 lb 4 oz (115.3 kg)    Physical Exam  Constitutional: He is oriented to person, place, and time. He appears well-developed and well-nourished.  HENT:  Head: Normocephalic and atraumatic.  Pulmonary/Chest: Effort normal. No respiratory distress.  Musculoskeletal:       Right hand: He exhibits decreased range of motion, tenderness and swelling. He exhibits normal capillary refill.       Left hand: He exhibits normal range of motion and no  tenderness.  On the Right hand, there is Swelling 3rd and ring ringers at the DIP and PIP joints, more so the latter.  There is mild to moderate non-point tenderness in the same area.  Pt has increased pain with passive ROM of those fingers.  He has decreased active ROM of the two fingers.   On the left hand there is no visible abnormality and no palpable tenderness.  The area of reported discomfort is along the radial side of the index finger.   Neurological: He is alert and oriented to person, place, and time.  Skin: Skin is warm and dry.  Psychiatric: He has a normal mood and affect. His behavior is normal.  Nursing note and vitals reviewed.        Assessment & Plan:   Encounter Diagnoses  Name Primary?  . Right hand pain Yes  . Swelling of right middle finger     -Xray r hand to include 3rd and 4th fingers in light of swelling of the joints -rx naproxen - use ice to the area 10-20 minutes 3 or 4 times daily -pt to follow up 1 month to recheck.  RTO sooner prn worsening or new symptoms

## 2018-02-02 ENCOUNTER — Encounter: Payer: Self-pay | Admitting: Physician Assistant

## 2018-02-02 ENCOUNTER — Ambulatory Visit: Payer: Self-pay | Admitting: Physician Assistant

## 2018-02-02 VITALS — BP 124/78 | HR 80 | Temp 97.9°F | Ht 70.5 in | Wt 244.5 lb

## 2018-02-02 DIAGNOSIS — M7989 Other specified soft tissue disorders: Secondary | ICD-10-CM

## 2018-02-02 DIAGNOSIS — M79641 Pain in right hand: Secondary | ICD-10-CM

## 2018-02-02 NOTE — Progress Notes (Signed)
BP 124/78   Pulse 80   Temp 97.9 F (36.6 C)   Ht 5' 10.5" (1.791 m)   Wt 244 lb 8 oz (110.9 kg)   SpO2 98%   BMI 34.59 kg/m    Subjective:    Patient ID: Scott Atkins, male    DOB: 1986-04-25, 32 y.o.   MRN: [id number]  HPI: Scott Atkins is a 32 y.o. male presenting on 02/02/2018 for Follow-up (finger)   HPI   Pt states his L hand got better but his R hand did not.  R middle finger still swelled.     Pt took the naproxen for a while and then stopped it because he says it didn't work  Xray was done and reviewed with pt.  Pt didn't turn in his [facility] application yet.  Pt says his R finger is still swelled and hurts.  Relevant past medical, surgical, family and social history reviewed and updated as indicated. Interim medical history since our last visit reviewed. Allergies and medications reviewed and updated.    Current Outpatient Medications:  .  LORazepam (ATIVAN) 0.5 MG tablet, Take 0.5 mg by mouth 2 (two) times daily as needed for anxiety., Disp: , Rfl:  .  perphenazine (TRILAFON) 4 MG tablet, Take 4 mg by mouth at bedtime., Disp: , Rfl:  .  triamcinolone cream (KENALOG) 0.1 %, Apply 1 application topically 2 (two) times daily as needed., Disp: 30 g, Rfl: 1 .  naproxen (NAPROSYN) 500 MG tablet, Take 1 tablet (500 mg total) by mouth 2 (two) times daily with a meal. (Patient not taking: Reported on 02/02/2018), Disp: 60 tablet, Rfl: 1   Review of Systems  Constitutional: Negative for appetite change, chills, diaphoresis, fatigue, fever and unexpected weight change.  HENT: Negative for congestion, dental problem, drooling, ear pain, facial swelling, hearing loss, mouth sores, sneezing, sore throat, trouble swallowing and voice change.   Eyes: Negative for pain, discharge, redness, itching and visual disturbance.  Respiratory: Negative for cough, choking, shortness of breath and wheezing.   Cardiovascular: Negative for chest pain, palpitations and leg swelling.   Gastrointestinal: Negative for abdominal pain, blood in stool, constipation, diarrhea and vomiting.  Endocrine: Negative for cold intolerance, heat intolerance and polydipsia.  Genitourinary: Negative for decreased urine volume, dysuria and hematuria.  Musculoskeletal: Positive for arthralgias. Negative for back pain and gait problem.  Skin: Positive for rash.  Allergic/Immunologic: Negative for environmental allergies.  Neurological: Negative for seizures, syncope, light-headedness and headaches.  Hematological: Negative for adenopathy.  Psychiatric/Behavioral: Positive for agitation and dysphoric mood. Negative for suicidal ideas. The patient is nervous/anxious.     Per HPI unless specifically indicated above     Objective:    BP 124/78   Pulse 80   Temp 97.9 F (36.6 C)   Ht 5' 10.5" (1.791 m)   Wt 244 lb 8 oz (110.9 kg)   SpO2 98%   BMI 34.59 kg/m   Wt Readings from Last 3 Encounters:  02/02/18 244 lb 8 oz (110.9 kg)  01/05/18 244 lb 8 oz (110.9 kg)  11/22/17 252 lb (114.3 kg)    Physical Exam  Constitutional: He is oriented to person, place, and time. He appears well-developed and well-nourished.  HENT:  Head: Normocephalic and atraumatic.  Pulmonary/Chest: Effort normal. No respiratory distress.  Musculoskeletal:       Right hand: He exhibits decreased range of motion, tenderness and swelling. He exhibits normal capillary refill, no deformity and no laceration. Normal  sensation noted.  Right middle finger with mild swelling and nonpoint tenderness.  Pt is unable to fully flex the long finger.  No redness or other sign infection.  No deformity  Neurological: He is alert and oriented to person, place, and time.  Skin: Skin is warm and dry.  Psychiatric: He has a normal mood and affect.  Nursing note and vitals reviewed.       Assessment & Plan:    Encounter Diagnoses  Name Primary?  . Right hand pain Yes  . Swelling of right middle finger     -pt was Given  another [facility] application and he was encouraged to get it turned in asap -in light of persistent pain and swelling and failure with improve with NSAIDS, will Refer to orthopedics for further evaluation and treatment -pt to Follow up march as scheduled.  RTO sooner prn

## 2018-04-13 ENCOUNTER — Encounter: Payer: Self-pay | Admitting: Orthopedic Surgery

## 2018-04-28 ENCOUNTER — Emergency Department (HOSPITAL_COMMUNITY)
Admission: EM | Admit: 2018-04-28 | Discharge: 2018-04-30 | Disposition: A | Discharge status: Home / Self Care | Payer: Self-pay | Attending: Emergency Medicine | Admitting: Emergency Medicine

## 2018-04-28 DIAGNOSIS — F22 Delusional disorders: Secondary | ICD-10-CM

## 2018-04-28 DIAGNOSIS — Z79899 Other long term (current) drug therapy: Secondary | ICD-10-CM | POA: Insufficient documentation

## 2018-04-28 DIAGNOSIS — F209 Schizophrenia, unspecified: Secondary | ICD-10-CM | POA: Insufficient documentation

## 2018-04-28 DIAGNOSIS — F17210 Nicotine dependence, cigarettes, uncomplicated: Secondary | ICD-10-CM | POA: Insufficient documentation

## 2018-04-29 ENCOUNTER — Other Ambulatory Visit: Payer: Self-pay

## 2018-04-29 ENCOUNTER — Encounter (HOSPITAL_COMMUNITY): Payer: Self-pay | Admitting: Emergency Medicine

## 2018-04-29 DIAGNOSIS — F209 Schizophrenia, unspecified: Secondary | ICD-10-CM | POA: Diagnosis present

## 2018-04-29 LAB — CBC WITH DIFFERENTIAL/PLATELET
Basophils Absolute: 0 10*3/uL (ref 0.0–0.1)
Basophils Relative: 0 %
EOS PCT: 2 %
Eosinophils Absolute: 0.2 10*3/uL (ref 0.0–0.7)
HEMATOCRIT: 42.2 % (ref 39.0–52.0)
Hemoglobin: 13.6 g/dL (ref 13.0–17.0)
LYMPHS PCT: 33 %
Lymphs Abs: 2.6 10*3/uL (ref 0.7–4.0)
MCH: 29.4 pg (ref 26.0–34.0)
MCHC: 32.2 g/dL (ref 30.0–36.0)
MCV: 91.1 fL (ref 78.0–100.0)
MONOS PCT: 7 %
Monocytes Absolute: 0.6 10*3/uL (ref 0.1–1.0)
NEUTROS ABS: 4.6 10*3/uL (ref 1.7–7.7)
Neutrophils Relative %: 58 %
PLATELETS: 365 10*3/uL (ref 150–400)
RBC: 4.63 MIL/uL (ref 4.22–5.81)
RDW: 13.3 % (ref 11.5–15.5)
WBC: 8 10*3/uL (ref 4.0–10.5)

## 2018-04-29 LAB — COMPREHENSIVE METABOLIC PANEL
ALBUMIN: 4.6 g/dL (ref 3.5–5.0)
ALT: 30 U/L (ref 0–44)
AST: 35 U/L (ref 15–41)
Alkaline Phosphatase: 79 U/L (ref 38–126)
Anion gap: 8 (ref 5–15)
BILIRUBIN TOTAL: 0.7 mg/dL (ref 0.3–1.2)
BUN: 6 mg/dL (ref 6–20)
CHLORIDE: 107 mmol/L (ref 98–111)
CO2: 25 mmol/L (ref 22–32)
CREATININE: 0.92 mg/dL (ref 0.61–1.24)
Calcium: 9.1 mg/dL (ref 8.9–10.3)
GFR calc Af Amer: >60 mL/min (ref >60)
GLUCOSE: 98 mg/dL (ref 70–99)
POTASSIUM: 3.4 mmol/L — AB (ref 3.5–5.1)
Sodium: 140 mmol/L (ref 135–145)
TOTAL PROTEIN: 8.3 g/dL — AB (ref 6.5–8.1)

## 2018-04-29 LAB — RAPID URINE DRUG SCREEN, HOSP PERFORMED
Amphetamines: NOT DETECTED
BARBITURATES: NOT DETECTED
Benzodiazepines: NOT DETECTED
Cocaine: NOT DETECTED
Opiates: NOT DETECTED
Tetrahydrocannabinol: POSITIVE — AB

## 2018-04-29 LAB — ETHANOL

## 2018-04-29 MED ORDER — ACETAMINOPHEN 325 MG PO TABS: 650.0000 mg | ORAL_TABLET | ORAL | Status: DC | PRN

## 2018-04-29 MED ORDER — ZIPRASIDONE MESYLATE 20 MG IM SOLR
20.0000 mg | Freq: Once | INTRAMUSCULAR | Status: DC
  Filled 2018-04-29: qty 20

## 2018-04-29 MED ORDER — NICOTINE 21 MG/24HR TD PT24
21.0000 mg | MEDICATED_PATCH | Freq: Once | TRANSDERMAL | Status: AC
  Administered 2018-04-29: 21 mg via TRANSDERMAL
  Filled 2018-04-29: qty 1

## 2018-04-29 MED ORDER — POTASSIUM CHLORIDE CRYS ER 20 MEQ PO TBCR
40.0000 meq | EXTENDED_RELEASE_TABLET | Freq: Once | ORAL | Status: AC
  Administered 2018-04-29: 40 meq via ORAL
  Filled 2018-04-29 (×2): qty 2

## 2018-04-29 NOTE — ED Notes (Signed)
Mother's contact info: (H) [phone]; (C) [phone]

## 2018-04-29 NOTE — ED Notes (Signed)
Patient calm at this time and resting. 

## 2018-04-29 NOTE — ED Notes (Signed)
Out of bed to BR 

## 2018-04-29 NOTE — ED Notes (Signed)
Family has brought clean clothing and have removed strings from his sweat pants  Pt currently visiting with family   Mother to desk to express her thanks for his care and for our trying to help pt and find him care

## 2018-04-29 NOTE — ED Notes (Signed)
Call to dietary and explained that pt request food that he can open as he has refused breakfast  Asked for food in packages he can open

## 2018-04-29 NOTE — ED Notes (Signed)
Patient alert and cooperative. Patient states he does not need any snacks or fluids. Patient was given water earlier. Patient states that he is fine at this time.

## 2018-04-29 NOTE — ED Notes (Signed)
[facility]

## 2018-04-29 NOTE — ED Notes (Signed)
Family updated in waiting room,
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
## 2018-04-29 NOTE — ED Notes (Signed)
Pt wanted sister's number Myra listed in chart. [phone]
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
## 2018-04-29 NOTE — ED Notes (Signed)
Watching TV Quiet. Calm  Polite and appropriate

## 2018-04-29 NOTE — ED Notes (Signed)
Pt has eaten both trays of food He is lying down on stretcher with his mother at bedside

## 2018-04-29 NOTE — ED Notes (Signed)
Patient is acting out at this time being verbally aggressive. [employer] and security at bedside of patient.

## 2018-04-29 NOTE — ED Triage Notes (Signed)
Pt brought in by Mother for IVC, pt thought he was being brought in for his Mother to be seen, Mother acted as if she was sick and needed medical attn in an attempt to get the pt to the hospital, Mother states pt is paranoid and thinks people are moving and messing with his belongings and poisoning his food, pt denies all accusations, denies SI/HI, pt upset when found out real reason he was here and that his Mother had taken out IVC paperwork on him, pt was easily able to be calmed down and cooperative. [employer] at bedside and IVC procedure explained, pt verbalized understanding

## 2018-04-29 NOTE — ED Provider Notes (Signed)
[facility] EMERGENCY DEPARTMENT Provider Note   CSN: [id number] Arrival date & time: 04/28/18  2339  Time seen 12:10 AM   History   Chief Complaint Chief Complaint  Patient presents with  . Medical Clearance   Level 5 caveat for psychiatric illness  HPI Scott Atkins is a 32 y.o. male.  HPI patient presents to the emergency department via the police.  His mother filled out IVC papers today.  Patient states he does not know why he is here.  He states he and his mother had some words however he states it was nothing unusual.  He states he has gone to a psychiatrist before and he has medications he takes as needed for sleep.  He denies being in a psychiatric hospital before.  He states he spent the day today with his uncles and he was going to go out a Air traffic controller.  Evidently his mother lurred him to the ED by stating she needed to be seen as a patient and she even somehow caught on a patient armband.  When he arrived she had the police serve his IVC papers.  Reading her statement she states he has been paranoid and will not eat because he thinks his food is poisoned.  Patient does agree that he thinks his food is being poisoned.  He will not say who is doing it.  He also states when he wakes up he feels like somebody has been plucking the hair out of his beard at night.  He does not know who is doing it.  He denies punching the walls or the refrigerator but states sometimes when he wakes up he has a finger on his right hand and on his left hand that are out of joint and he does not know how it happens.  When patient first found out he was being committed he was very agitated and verbal aggressive however he did calm down easily.  PCP Jacquelin Hawking, PA-C   Past Medical History:  Diagnosis Date  . Mood disorder [facility])    pt says he was never told dx by psychiatrist    Patient Active Problem List   Diagnosis Date Noted  . Cigarette nicotine dependence without complication 09/23/2016     Past Surgical History:  Procedure Laterality Date  . TYMPANOSTOMY TUBE PLACEMENT          Home Medications    Prior to Admission medications   Medication Sig Start Date End Date Taking? Authorizing Provider  LORazepam (ATIVAN) 0.5 MG tablet Take 0.5 mg by mouth 2 (two) times daily as needed for anxiety.    [provider]  naproxen (NAPROSYN) 500 MG tablet Take 1 tablet (500 mg total) by mouth 2 (two) times daily with a meal. Patient not taking: Reported on 02/02/2018 01/05/18   Jacquelin Hawking, PA-C  perphenazine (TRILAFON) 4 MG tablet Take 4 mg by mouth at bedtime.    [provider]  triamcinolone cream (KENALOG) 0.1 % Apply 1 application topically 2 (two) times daily as needed. 11/22/17   Jacquelin Hawking, PA-C    Family History Family History  Problem Relation Age of Onset  . Heart attack Maternal Grandfather     Social History Social History   Tobacco Use  . Smoking status: Current Every Day Smoker    Packs/day: 0.25    Years: 12.00    Pack years: 3.00    Types: Cigarettes, Cigars  . Smokeless tobacco: Never Used  Substance Use Topics  .  Alcohol use: Yes    Comment: "drinks rarely"  . Drug use: Yes    Types: Marijuana    Comment: occassionally  unemployed   Allergies   Penicillins   Review of Systems Review of Systems  Unable to perform ROS: Psychiatric disorder     Physical Exam Updated Vital Signs BP (!) 154/131 (BP Location: Right Arm)   Pulse (!) 117   Temp 98.9 F (37.2 C) (Oral)   Resp 18   Ht 5\' 11"  (1.803 m)   Wt 107.5 kg   SpO2 97%   BMI 33.05 kg/m   Vital signs normal except for hypertension and tachycardia   Physical Exam  Constitutional: He is oriented to person, place, and time. He appears well-developed and well-nourished.  Non-toxic appearance. He does not appear ill. No distress.  HENT:  Head: Normocephalic and atraumatic.  Right Ear: External ear normal.  Left Ear: External ear normal.  Nose: Nose  normal. No mucosal edema or rhinorrhea.  Mouth/Throat: Oropharynx is clear and moist and mucous membranes are normal. No dental abscesses or uvula swelling.  Eyes: Pupils are equal, round, and reactive to light. Conjunctivae and EOM are normal.  Neck: Normal range of motion and full passive range of motion without pain. Neck supple.  Cardiovascular: Normal rate, regular rhythm and normal heart sounds. Exam reveals no gallop and no friction rub.  No murmur heard. Pulmonary/Chest: Effort normal and breath sounds normal. No respiratory distress. He has no wheezes. He has no rhonchi. He has no rales. He exhibits no tenderness and no crepitus.  Abdominal: Soft. Normal appearance and bowel sounds are normal. He exhibits no distension. There is no tenderness. There is no rebound and no guarding.  Musculoskeletal: Normal range of motion. He exhibits no edema or tenderness.  Moves all extremities well.   Neurological: He is alert and oriented to person, place, and time. He has normal strength. No cranial nerve deficit.  Skin: Skin is warm, dry and intact. No rash noted. No erythema. No pallor.  Psychiatric: His speech is normal. His mood appears anxious. He is agitated. Thought content is paranoid. He expresses no homicidal and no suicidal ideation.  Patient makes good eye contact.  He does not appear to be responding to internal stimuli.  He does display some paranoid thoughts such as thinking he is being poisoned, and that people are plucking hairs out of his beard.  Nursing note and vitals reviewed.    ED Treatments / Results  Labs (all labs ordered are listed, but only abnormal results are displayed) Results for orders placed or performed during the hospital encounter of 04/28/18  Comprehensive metabolic panel  Result Value Ref Range   Sodium 140 135 - 145 mmol/L   Potassium 3.4 (L) 3.5 - 5.1 mmol/L   Chloride 107 98 - 111 mmol/L   CO2 25 22 - 32 mmol/L   Glucose, Bld 98 70 - 99 mg/dL   BUN 6  6 - 20 mg/dL   Creatinine, Ser 4.090.92 0.61 - 1.24 mg/dL   Calcium 9.1 8.9 - 81.110.3 mg/dL   Total Protein 8.3 (H) 6.5 - 8.1 g/dL   Albumin 4.6 3.5 - 5.0 g/dL   AST 35 15 - 41 U/L   ALT 30 0 - 44 U/L   Alkaline Phosphatase 79 38 - 126 U/L   Total Bilirubin 0.7 0.3 - 1.2 mg/dL   GFR calc non Af Amer >60 >60 mL/min   GFR calc Af Amer >60 >60 mL/min  Anion gap 8 5 - 15  Ethanol  Result Value Ref Range   Alcohol, Ethyl (B) <10 <10 mg/dL  CBC with Differential  Result Value Ref Range   WBC 8.0 4.0 - 10.5 K/uL   RBC 4.63 4.22 - 5.81 MIL/uL   Hemoglobin 13.6 13.0 - 17.0 g/dL   HCT 16.1 09.6 - 04.5 %   MCV 91.1 78.0 - 100.0 fL   MCH 29.4 26.0 - 34.0 pg   MCHC 32.2 30.0 - 36.0 g/dL   RDW 40.9 81.1 - 91.4 %   Platelets 365 150 - 400 K/uL   Neutrophils Relative % 58 %   Neutro Abs 4.6 1.7 - 7.7 K/uL   Lymphocytes Relative 33 %   Lymphs Abs 2.6 0.7 - 4.0 K/uL   Monocytes Relative 7 %   Monocytes Absolute 0.6 0.1 - 1.0 K/uL   Eosinophils Relative 2 %   Eosinophils Absolute 0.2 0.0 - 0.7 K/uL   Basophils Relative 0 %   Basophils Absolute 0.0 0.0 - 0.1 K/uL  Urine rapid drug screen (hosp performed)  Result Value Ref Range   Opiates NONE DETECTED NONE DETECTED   Cocaine NONE DETECTED NONE DETECTED   Benzodiazepines NONE DETECTED NONE DETECTED   Amphetamines NONE DETECTED NONE DETECTED   Tetrahydrocannabinol POSITIVE (A) NONE DETECTED   Barbiturates NONE DETECTED NONE DETECTED   Laboratory interpretation all normal except positive UDS for marijuana, minimal hypokalemia    EKG None  Radiology No results found.  Procedures Procedures (including critical care time)      Medications Ordered in ED Medications  ziprasidone (GEODON) injection 20 mg (0 mg Intramuscular Hold 04/29/18 0025)  acetaminophen (TYLENOL) tablet 650 mg (has no administration in time range)  potassium chloride SA (K-DUR,KLOR-CON) CR tablet 40 mEq (has no administration in time range)     Initial  Impression / Assessment and Plan / ED Course  I have reviewed the triage vital signs and the nursing notes.  Pertinent labs & imaging results that were available during my care of the patient were reviewed by me and considered in my medical decision making (see chart for details).    I have talked to the patient that his mother has started these IVC proceedings.  He would have a medical evaluation.  Patient is very afraid of needles.  I did explain that we would need to get blood work on him.  I also explained that if he got agitated or upset again he would have to get a shot for sedation.  I also explained that he may need to stay in the ED until the morning to have a psychiatrist evaluate him which seemed to get him a little upset.  2:54 AM patient's laboratory tests have resulted.  Patient was placed in psych holding orders and TTS consult was ordered.  04:30 AM TTS, Caprice Renshaw has evaluated patient, discussed with their psychiatric PA.  They recommend observation until the psychiatrist can evaluate in the morning.  Patient has been totally cooperative to me, he appears to have some paranoia but I am not sure that the patient is committable.  I did not fill out the second opinion, I will let the psychiatrist decide if he needs to be committed or not.  Final Clinical Impressions(s) / ED Diagnoses   Final diagnoses:  Paranoia (HCC)    Disposition pending  Devoria Albe, MD, Concha Pyo, MD 04/29/18 [phone]

## 2018-04-29 NOTE — ED Notes (Signed)
[facility] to ascertain status and informed of pt being cooperative, as well as family support

## 2018-04-29 NOTE — ED Notes (Signed)
family visiting  Pt has opened snacks himself and eaten  He declines his dinner tray as his mother reports he has had same for lunch

## 2018-04-29 NOTE — Progress Notes (Signed)
Per Reola Calkinsravis Money NP, patient meets criteria for inpatient treatment. [facility]. CSW faxed referrals to the following facilities for review:  [facility], [facility], [facility], [facility], [facility], [facility], [facility], [facility], [facility], and [facility].   TTS will continue to seek bed placement.   Trula SladeHeather Smart, MSW, LCSW Clinical Social Worker 04/29/2018 9:42 AM

## 2018-04-29 NOTE — ED Notes (Signed)
Mother with pt who agrees to meds  Pt is quiet and cooperative at this time

## 2018-04-29 NOTE — ED Notes (Signed)
Pt to shower- bed changed and room searched for contraband  Request foods that he can open- he has refused breakfast  Pants hat and belt removed from room by Raynelle FanningJulie, RN  Pt reports he had an outpt therapist, has not seen in over 4 months  Never inpt   Denies SI/HI. A/V/T hallucinations

## 2018-04-29 NOTE — ED Notes (Signed)
Pt placed in scrubs. 

## 2018-04-29 NOTE — ED Notes (Signed)
Mother at bedside  She is supportive and encouraging   Pt offered 2 food trays One tray with packaging that pt can open-nabs, cookies, canned drink opened in front of pt and poured One tray that pt can eat with fingers- burger, chips, condiment packages

## 2018-04-29 NOTE — ED Notes (Signed)
Family took pt's shoes home as they have shoestrings

## 2018-04-29 NOTE — ED Notes (Signed)
Offered breakfast tray to Pt.  Refused, but did request orange juice.  2 OJ's given to Pt.

## 2018-04-29 NOTE — ED Provider Notes (Signed)
Patient has been accepted for admission by behavioral health.  They are looking for placement.  This is for psychosis.  Based on this patient will be IVC informally.  Paperwork completed by me.   Vanetta MuldersZackowski, Tarrell Debes, MD 04/29/18 1725

## 2018-04-29 NOTE — ED Notes (Signed)
Clothing, lighters, and other contraband taken by Raynelle FanningJulie, Charity fundraiserN  After patient asked for cell phone charger  Family at bedside  Security called to wand

## 2018-04-29 NOTE — ED Notes (Signed)
Patient calm at this time. [employer] and security at bedside.

## 2018-04-30 ENCOUNTER — Other Ambulatory Visit: Payer: Self-pay

## 2018-04-30 ENCOUNTER — Inpatient Hospital Stay (HOSPITAL_COMMUNITY)
Admission: AD | Admit: 2018-04-30 | Discharge: 2018-05-08 | DRG: 885 | Disposition: A | Discharge status: Home / Self Care | Payer: Federal, State, Local not specified - Other | Attending: Psychiatry | Admitting: Psychiatry

## 2018-04-30 ENCOUNTER — Encounter (HOSPITAL_COMMUNITY): Payer: Self-pay

## 2018-04-30 DIAGNOSIS — R451 Restlessness and agitation: Secondary | ICD-10-CM | POA: Diagnosis not present

## 2018-04-30 DIAGNOSIS — G4700 Insomnia, unspecified: Secondary | ICD-10-CM | POA: Diagnosis present

## 2018-04-30 DIAGNOSIS — Z79899 Other long term (current) drug therapy: Secondary | ICD-10-CM | POA: Diagnosis not present

## 2018-04-30 DIAGNOSIS — F209 Schizophrenia, unspecified: Secondary | ICD-10-CM | POA: Diagnosis present

## 2018-04-30 DIAGNOSIS — F419 Anxiety disorder, unspecified: Secondary | ICD-10-CM | POA: Diagnosis present

## 2018-04-30 DIAGNOSIS — F1290 Cannabis use, unspecified, uncomplicated: Secondary | ICD-10-CM | POA: Diagnosis not present

## 2018-04-30 DIAGNOSIS — Z880 Allergy status to penicillin: Secondary | ICD-10-CM | POA: Diagnosis not present

## 2018-04-30 DIAGNOSIS — F319 Bipolar disorder, unspecified: Secondary | ICD-10-CM | POA: Diagnosis present

## 2018-04-30 DIAGNOSIS — Z9114 Patient's other noncompliance with medication regimen: Secondary | ICD-10-CM

## 2018-04-30 DIAGNOSIS — F17210 Nicotine dependence, cigarettes, uncomplicated: Secondary | ICD-10-CM | POA: Diagnosis present

## 2018-04-30 DIAGNOSIS — F200 Paranoid schizophrenia: Secondary | ICD-10-CM | POA: Diagnosis not present

## 2018-04-30 MED ORDER — TRAZODONE HCL 50 MG PO TABS
50.0000 mg | ORAL_TABLET | Freq: Every day | ORAL | Status: DC
  Administered 2018-04-30: 50 mg via ORAL
  Filled 2018-04-30: qty 1

## 2018-04-30 MED ORDER — HYDROXYZINE HCL 25 MG PO TABS
25.0000 mg | ORAL_TABLET | Freq: Three times a day (TID) | ORAL | Status: DC | PRN
  Administered 2018-05-05: 25 mg via ORAL
  Filled 2018-04-30: qty 10
  Filled 2018-04-30: qty 1

## 2018-04-30 MED ORDER — LORAZEPAM 2 MG/ML IJ SOLN: 2.0000 mg | Freq: Four times a day (QID) | INTRAMUSCULAR | Status: DC | PRN

## 2018-04-30 MED ORDER — LORAZEPAM 1 MG PO TABS
2.0000 mg | ORAL_TABLET | Freq: Four times a day (QID) | ORAL | Status: DC | PRN
  Administered 2018-04-30: 2 mg via ORAL
  Filled 2018-04-30: qty 2

## 2018-04-30 MED ORDER — ALUM & MAG HYDROXIDE-SIMETH 200-200-20 MG/5ML PO SUSP: 30.0000 mL | ORAL | Status: DC | PRN

## 2018-04-30 MED ORDER — HALOPERIDOL 5 MG PO TABS: 5.0000 mg | ORAL_TABLET | Freq: Four times a day (QID) | ORAL | Status: DC | PRN

## 2018-04-30 MED ORDER — RISPERIDONE 1 MG PO TABS
1.0000 mg | ORAL_TABLET | Freq: Two times a day (BID) | ORAL | Status: DC
  Administered 2018-04-30 – 2018-05-02 (×4): 1 mg via ORAL
  Filled 2018-04-30 (×10): qty 1

## 2018-04-30 MED ORDER — DIPHENHYDRAMINE HCL 50 MG/ML IJ SOLN: 50.0000 mg | Freq: Four times a day (QID) | INTRAMUSCULAR | Status: DC | PRN

## 2018-04-30 MED ORDER — MAGNESIUM HYDROXIDE 400 MG/5ML PO SUSP: 30.0000 mL | Freq: Every day | ORAL | Status: DC | PRN

## 2018-04-30 MED ORDER — DIPHENHYDRAMINE HCL 25 MG PO CAPS: 50.0000 mg | ORAL_CAPSULE | Freq: Four times a day (QID) | ORAL | Status: DC | PRN

## 2018-04-30 MED ORDER — TRAZODONE HCL 50 MG PO TABS
50.0000 mg | ORAL_TABLET | Freq: Every evening | ORAL | Status: DC | PRN
  Administered 2018-05-04 – 2018-05-06 (×3): 50 mg via ORAL
  Filled 2018-04-30: qty 7
  Filled 2018-04-30 (×3): qty 1

## 2018-04-30 MED ORDER — HALOPERIDOL LACTATE 5 MG/ML IJ SOLN: 5.0000 mg | Freq: Four times a day (QID) | INTRAMUSCULAR | Status: DC | PRN

## 2018-04-30 MED ORDER — ACETAMINOPHEN 325 MG PO TABS: 650.0000 mg | ORAL_TABLET | Freq: Four times a day (QID) | ORAL | Status: DC | PRN

## 2018-04-30 NOTE — Progress Notes (Signed)
D: Pt denies SI/HI/AVH. Pt is pleasant and cooperative. Pt stated he wanted to do whatever he needed to do to get out as soon as possible. Pt was informed avg length of stay 3-7 days . Pt was ok and said he was " willing to do what I need to get out as soon as possible" pt visible on the unit and very appropriate this evening. Pt appears paranoid and suspicious but is appropriate.   A: Pt was offered support and encouragement. Pt was given scheduled medications. Pt was encourage to attend groups. Q 15 minute checks were done for safety.   R: safety maintained on unit.  Problem: Education: Goal: Emotional status will improve Outcome: Progressing   Problem: Education: Goal: Mental status will improve Outcome: Progressing   Problem: Coping: Goal: Coping ability will improve Outcome: Progressing   Problem: Education: Goal: Understanding of discharge needs will improve Outcome: Progressing

## 2018-04-30 NOTE — Progress Notes (Signed)
Pt stated he was a little anxious this evening, pt was given Ativan per [facility]

## 2018-04-30 NOTE — ED Notes (Signed)
Pt escorted by [employer] with belongings.  IVC paperwork given to [employer]. Mother called and updated

## 2018-04-30 NOTE — Progress Notes (Addendum)
Per Steffanie Rainwater, pt has been accepted to [facility] bed 505/02. Accepting provider is Reola Calkins NP, Attending provider is Dr. Altamese [place] MD. Patient can arrive anytime today, 04/30/18. Number for report is [phone]. Steffanie Rainwater spoke with Aggie Cosier Tax adviser regarding above information).   Trula Slade, MSW, LCSW Clinical Social Worker 04/30/2018 10:29 AM

## 2018-04-30 NOTE — [facility] Counselor (Signed)
Reassessment: Pt remains at ED.  When asked why he was at the hospital, Pt stated that he came in because of his mother.  Pt admitted to hearing voices, believing that his food was being poisoned, and that he was aggressive but ''I don't do that anymore.''  Pt appeared guarded and may be minimizing symptoms.  He denied current suicidal ideation, homicidal ideation, and hallucination.  Pt stated that he does not have a current psychiatrist or therapist.  Recommend continued inpatient.   Per assessment:  32 y.o. male, who presents involuntary and unaccompanied to [facility]. Clinician asked the pt, "what brought you to the hospital?" Pt reported, he was at his uncles house at a "kick back," he came home to shower and get ready to go to a bar. Pt reported, his mother told him she felt like she was having stroke so he accompanied her to the ED. Pt reported, once he got to the hospital, officers took him to the room he is currently in. Pt denies, SI, HI, AVH, self-injurious behaviors and access to weapons.   Pt was IVC'd by his mother. 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 He is very aggressive acting towards his family, mostly verbal. Respondent hears voices, has conversations with others who are no there. Thinks his family is poisoning his food, allowing others to wear his clothes, he has also threatened to hurt his mother. Respondent does not eat very much because he thinks he is being poisoned. He lost weight 5 to 10 LBS in last two weeks. Respondent curses a lot, very disrespectful. Very loud and scary. Danger to others. He has put holes in walls at home. He has hit refrigerator with fist this week. He will tell his mother I am trying not to put my hands on you. Respondent is not taking his medications. Pt denies, the content of the IVC. Pt reported, he feels he is being pranked. Pt used the example of taking icing off a cupcake and putting toothpaste instead.

## 2018-04-30 NOTE — ED Notes (Signed)
PT has been sleeping each time checked on this morning.  Sitter at bedside.

## 2018-04-30 NOTE — Tx Team (Signed)
Initial Treatment Plan 04/30/2018 5:24 PM Scott Atkins [id number]    PATIENT STRESSORS: Marital or family conflict Medication change or noncompliance Substance abuse   PATIENT STRENGTHS: Communication skills Physical Health Supportive family/friends   PATIENT IDENTIFIED PROBLEMS: Substance abuse  Anxiety  "peace of mind."  "Learn how to be patient."               DISCHARGE CRITERIA:  Improved stabilization in mood, thinking, and/or behavior Medical problems require only outpatient monitoring Motivation to continue treatment in a less acute level of care  PRELIMINARY DISCHARGE PLAN: Attend aftercare/continuing care group Attend PHP/IOP Attend 12-step recovery group Outpatient therapy Return to previous living arrangement  PATIENT/FAMILY INVOLVEMENT: This treatment plan has been presented to and reviewed with the patient, Scott Atkins, and/or family member.  The patient and family have been given the opportunity to ask questions and make suggestions.  Bethann Punches, RN 04/30/2018, 5:24 PM

## 2018-04-30 NOTE — Progress Notes (Signed)
Scott Atkins is a 32 y.o. male Involuntary admitted for paranoid behavior from [facility]. Pt calm and cooperative with admission. Pt did voice that he felt like it was a set up for him to be here. Pt stated he had a small misunderstanding with his mom, and mom tricked him into coming here. Pt alert and oriented x 4, answered all the questions appropriately. Consents signed, skin/belongings search completed and pt oriented to unit. Pt stable at this time. Pt given the opportunity to express concerns and ask questions. Pt given toiletries. Will continue to monitor.

## 2018-04-30 NOTE — Progress Notes (Signed)
Pt very paranoid about his food, refused to eat his because it was not wrapped. Pt think that he is being poisoned, does not want to be around people, could not stand on the line in the cafeteria.

## 2018-05-01 DIAGNOSIS — F419 Anxiety disorder, unspecified: Secondary | ICD-10-CM

## 2018-05-01 DIAGNOSIS — F209 Schizophrenia, unspecified: Principal | ICD-10-CM

## 2018-05-01 DIAGNOSIS — F17210 Nicotine dependence, cigarettes, uncomplicated: Secondary | ICD-10-CM

## 2018-05-01 NOTE — B[facility] Suicide Risk Assessment (Signed)
[facility] Admission Suicide Risk Assessment   Nursing information obtained from:  Patient Demographic factors:  Male Current Mental Status:  NA(denies SI) Loss Factors:  Loss of significant relationship Historical Factors:  Impulsivity Risk Reduction Factors:  Living with another person, especially a relative  Total Time spent with patient: 1 hour Principal Problem: Schizophrenia (HCC) Diagnosis:   Patient Active Problem List   Diagnosis Date Noted  . Schizophrenia (HCC) [F20.9] 04/29/2018  . Cigarette nicotine dependence without complication [F17.210] 09/23/2016   Subjective Data: see H&P  Continued Clinical Symptoms:  Alcohol Use Disorder Identification Test Final Score (AUDIT): 8 The "Alcohol Use Disorders Identification Test", Guidelines for Use in Primary Care, Second Edition.  World Science writer [facility]). Score between 0-7:  no or low risk or alcohol related problems. Score between 8-15:  moderate risk of alcohol related problems. Score between 16-19:  high risk of alcohol related problems. Score 20 or above:  warrants further diagnostic evaluation for alcohol dependence and treatment.   Psychiatric Specialty Exam: Physical Exam  Nursing note and vitals reviewed.     Blood pressure 102/75, pulse 84, temperature 98.2 F (36.8 C), temperature source Oral, resp. rate 18, height 5\' 11"  (1.803 m), weight 106.1 kg, SpO2 100 %.Body mass index is 32.64 kg/m.   COGNITIVE FEATURES THAT CONTRIBUTE TO RISK:  None    SUICIDE RISK:   Minimal: No identifiable suicidal ideation.  Patients presenting with no risk factors but with morbid ruminations; may be classified as minimal risk based on the severity of the depressive symptoms  PLAN OF CARE: See H&P  I certify that inpatient services furnished can reasonably be expected to improve the patient's condition.   Micheal Likens, MD 05/01/2018, 2:51 PM

## 2018-05-01 NOTE — Progress Notes (Signed)
Recreation Therapy Notes  Date: 05/01/18 Time: 1000 Location: 500 Hall Dayroom  Group Topic: Goal Setting  Goal Area(s) Addresses:  Patient will be able to identify at least 3 life goals.  Patient will be able to identify benefit of investing in life goals.  Patient will be able to identify benefit of setting life goals.   Intervention: Worksheet, pencils  Activity: Goal Planning.  Patients were given Atkins worksheet in which they were to set goals for the next week, the next month, the next year and in five years. Patients were to then identify the obstacles that would prevent reaching their goals, what they need to achieve their goals and what they can do tomorrow to work towards the goals.  Education: Discharge Planning, Coping Skills, Leisure Education  Education Outcome: Acknowledges Education/In Group Clarification Provided/Needs Additional Education  Clinical Observations:  Pt did not attend group.    Scott Atkins, LRT/CTRS         Scott Atkins 05/01/2018 12:04 PM 

## 2018-05-01 NOTE — Tx Team (Signed)
Interdisciplinary Treatment and Diagnostic Plan Update  05/01/2018 Time of Session: 12:57 PM  Scott Atkins MRN: [id number]  Principal Diagnosis: Schizophrenia [facility])  Secondary Diagnoses: Principal Problem:   Schizophrenia ([place])   Current Medications:  Current Facility-Administered Medications  Medication Dose Route Frequency Provider Last Rate Last Dose  . acetaminophen (TYLENOL) tablet 650 mg  650 mg Oral Q6H PRN Money, Lowry Ram, FNP      . alum & mag hydroxide-simeth (MAALOX/MYLANTA) 200-200-20 MG/5ML suspension 30 mL  30 mL Oral Q4H PRN Money, Darnelle Maffucci B, FNP      . diphenhydrAMINE (BENADRYL) capsule 50 mg  50 mg Oral Q6H PRN Money, Lowry Ram, FNP       Or  . diphenhydrAMINE (BENADRYL) injection 50 mg  50 mg Intramuscular Q6H PRN Money, Darnelle Maffucci B, FNP      . haloperidol (HALDOL) tablet 5 mg  5 mg Oral Q6H PRN Money, Darnelle Maffucci B, FNP       Or  . haloperidol lactate (HALDOL) injection 5 mg  5 mg Intramuscular Q6H PRN Money, Lowry Ram, FNP      . hydrOXYzine (ATARAX/VISTARIL) tablet 25 mg  25 mg Oral TID PRN Money, Lowry Ram, FNP      . LORazepam (ATIVAN) tablet 2 mg  2 mg Oral Q6H PRN Money, Lowry Ram, FNP   2 mg at 04/30/18 2308   Or  . LORazepam (ATIVAN) injection 2 mg  2 mg Intramuscular Q6H PRN Money, Darnelle Maffucci B, FNP      . magnesium hydroxide (MILK OF MAGNESIA) suspension 30 mL  30 mL Oral Daily PRN Money, Lowry Ram, FNP      . risperiDONE (RISPERDAL) tablet 1 mg  1 mg Oral BID Money, Lowry Ram, FNP   1 mg at 05/01/18 0747  . traZODone (DESYREL) tablet 50 mg  50 mg Oral QHS PRN Money, Lowry Ram, FNP        PTA Medications: Medications Prior to Admission  Medication Sig Dispense Refill Last Dose  . LORazepam (ATIVAN) 0.5 MG tablet Take 0.5 mg by mouth 2 (two) times daily as needed for anxiety.   Not Taking at Unknown time  . Multiple Vitamin (MULTIVITAMIN WITH MINERALS) TABS tablet Take 1 tablet by mouth daily.   Past Week at Unknown time  . perphenazine (TRILAFON) 4 MG tablet Take 4 mg by  mouth at bedtime.   Not Taking at Unknown time    Patient Stressors: Marital or family conflict Medication change or noncompliance Substance abuse  Patient Strengths: Armed forces logistics/support/administrative officer Physical Health Supportive family/friends  Treatment Modalities: Medication Management, Group therapy, Case management,  1 to 1 session with clinician, Psychoeducation, Recreational therapy.   Physician Treatment Plan for Primary Diagnosis: Schizophrenia ([place]) Long Term Goal(s): Improvement in symptoms so as ready for discharge  Short Term Goals: Ability to demonstrate self-control will improve Compliance with prescribed medications will improve  Medication Management: Evaluate patient's response, side effects, and tolerance of medication regimen.  Therapeutic Interventions: 1 to 1 sessions, Unit Group sessions and Medication administration.  Evaluation of Outcomes: Progressing  Physician Treatment Plan for Secondary Diagnosis: Principal Problem:   Schizophrenia ([place])   Long Term Goal(s): Improvement in symptoms so as ready for discharge  Short Term Goals: Ability to demonstrate self-control will improve Compliance with prescribed medications will improve  Medication Management: Evaluate patient's response, side effects, and tolerance of medication regimen.  Therapeutic Interventions: 1 to 1 sessions, Unit Group sessions and Medication administration.  Evaluation of Outcomes: Progressing   RN  Treatment Plan for Primary Diagnosis: Schizophrenia ([place]) Long Term Goal(s): Knowledge of disease and therapeutic regimen to maintain health will improve  Short Term Goals: Ability to identify and develop effective coping behaviors will improve and Compliance with prescribed medications will improve  Medication Management: RN will administer medications as ordered by provider, will assess and evaluate patient's response and provide education to patient for prescribed medication. RN will report  any adverse and/or side effects to prescribing provider.  Therapeutic Interventions: 1 on 1 counseling sessions, Psychoeducation, Medication administration, Evaluate responses to treatment, Monitor vital signs and CBGs as ordered, Perform/monitor CIWA, COWS, AIMS and Fall Risk screenings as ordered, Perform wound care treatments as ordered.  Evaluation of Outcomes: Progressing   LCSW Treatment Plan for Primary Diagnosis: Schizophrenia ([place]) Long Term Goal(s): Safe transition to appropriate next level of care at discharge, Engage patient in therapeutic group addressing interpersonal concerns.  Short Term Goals: Engage patient in aftercare planning with referrals and resources  Therapeutic Interventions: Assess for all discharge needs, 1 to 1 time with Social worker, Explore available resources and support systems, Assess for adequacy in community support network, Educate family and significant other(s) on suicide prevention, Complete Psychosocial Assessment, Interpersonal group therapy.  Evaluation of Outcomes: Met  Return home, follow up outpt   Progress in Treatment: Attending groups: Yes Participating in groups: Yes Taking medication as prescribed: Yes Toleration medication: Yes, no side effects reported at this time Family/Significant other contact made: No Patient understands diagnosis: No Limited insight Discussing patient identified problems/goals with staff: Yes Medical problems stabilized or resolved: Yes Denies suicidal/homicidal ideation: Yes Issues/concerns per patient self-inventory: None Other: N/A  New problem(s) identified: None identified at this time.   New Short Term/Long Term Goal(s): "I want to be calm and patient, and work out my plan before I act."   Discharge Plan or Barriers:   Reason for Continuation of Hospitalization: Anxiety Delusions  Paranoia Medication stabilization   Estimated Length of Stay: 9/6  Attendees: Patient: Scott Atkins 05/01/2018   12:57 PM  Physician: Maris Berger, MD 05/01/2018  12:57 PM  Nursing: Eulogio Bear, RN 05/01/2018  12:57 PM  RN Care Manager: Lars Pinks, RN 05/01/2018  12:57 PM  Social Worker: Ripley Fraise 05/01/2018  12:57 PM  Recreational Therapist: Winfield Cunas 05/01/2018  12:57 PM  Other: Norberto Sorenson 05/01/2018  12:57 PM  Other:  05/01/2018  12:57 PM    Scribe for Treatment Team:  Roque Lias LCSW 05/01/2018 12:57 PM

## 2018-05-01 NOTE — Progress Notes (Signed)
DAR NOTE: Patient presents with anxious affect and depressed mood. Pt hs been observed in the day room watching with peers but not interacting. Pt is still paranoid, pt is not eating his meals, goes to the cafeteria, but came right back. Pt reported good sleep, fair appetite, normal energy, and good concentration. Denies pain, auditory and visual hallucinations.  Rates depression at 0, hopelessness at 0, and anxiety at 0.  Maintained on routine safety checks.  Medications given as prescribed.  Support and encouragement offered as needed.  Attended group and participated.  States goal for today is "staying calm." Will continue to monitor.

## 2018-05-01 NOTE — BHH Counselor (Addendum)
Adult Comprehensive Assessment  Patient ID: Scott Atkins, male   DOB: Jan 13, 1986, 32 y.o.   MRN: [id number]  Information Source: Information source: Patient  Current Stressors:  Patient states their primary concerns and needs for treatment are:: See below Patient states their goals for this hospitilization and ongoing recovery are:: "I want to be more calm and patient, and work out a plan before I act." Employment / Job issues: Unemployed-no significant work history Family Relationships: "I get annoyed with my mother.  I get an attitude and I cuss. The bed rail put the holes in the wall when i was moving it." Financial / Lack of resources (include bankruptcy): States he has inheritence from grandmother Housing / Lack of housing: Stays with mother Substance abuse: bottle of liquor a week  Living/Environment/Situation:  Living Arrangements: Parent Living conditions (as described by patient or guardian): good Who else lives in the home?: niece How long has patient lived in current situation?: couple of years What is atmosphere in current home: Chaotic  Family History:  Are you sexually active?: No What is your sexual orientation?: straight Does patient have children?: No  Childhood History:  By whom was/is the patient raised?: Mother Additional childhood history information: father was peripherally involved Description of patient's relationship with caregiver when they were a child: good Patient's description of current relationship with people who raised him/her: good Does patient have siblings?: Yes Number of Siblings: 3 Description of patient's current relationship with siblings: sisters-typical relationship Did patient suffer any verbal/emotional/physical/sexual abuse as a child?: No Did patient suffer from severe childhood neglect?: No Has patient ever been sexually abused/assaulted/raped as an adolescent or adult?: No Was the patient ever a victim of a crime or a disaster?:  No Witnessed domestic violence?: No Has patient been effected by domestic violence as an adult?: No  Education:  Highest grade of school patient has completed: 10-went to [employer]-but got kicked out of there Currently a Consulting civil engineer?: No Learning disability?: No  Employment/Work Situation:   Employment situation: Employed Where is patient currently employed?: [employer] How long has patient been employed?: 7 moinths Patient's job has been impacted by current illness: No What is the longest time patient has a held a job?: [employer] Where was the patient employed at that time?: 7 months Did You Receive Any Psychiatric Treatment/Services While in the [employer]?: No Are There Guns or Other Weapons in Your Home?: No  Financial Resources:   Surveyor, quantity resources: Support from parents / caregiver Does patient have a Lawyer or guardian?: No  Alcohol/Substance Abuse:   What has been your use of drugs/alcohol within the last 12 months?: Denies cannabis, states he drinks a bottle of liquor a week Alcohol/Substance Abuse Treatment Hx: Denies past history Has alcohol/substance abuse ever caused legal problems?: No  Social Support System:   Conservation officer, nature Support System: Fair Describe Community Support System: Unwilling to describe Type of faith/religion: N/A How does patient's faith help to cope with current illness?: N/A  Leisure/Recreation:   Leisure and Hobbies: play tennis, go eat, shopping  Strengths/Needs:   What is the patient's perception of their strengths?: "I love the athlete side of myself.  I love soccer and foot ball." Patient states they can use these personal strengths during their treatment to contribute to their recovery: "I mind my business and stay to myself. Patient states these barriers may affect/interfere with their treatment: None Patient states these barriers may affect their return to the community: None Other  important information patient  would like considered in planning for their treatment: None  Discharge Plan:   Currently receiving community mental health services: No Patient states concerns and preferences for aftercare planning are: local mental health clinic Patient states they will know when they are safe and ready for discharge when: "Sleep alot.  I barely eat." Does patient have access to transportation?: Yes Does patient have financial barriers related to discharge medications?: Yes Patient description of barriers related to discharge medications: No insurance Will patient be returning to same living situation after discharge?: Yes  Summary/Recommendations:   Summary and Recommendations (to be completed by the evaluator): Scott Atkins is a 32 YO AA male diagnosed with Schizophrenia.  He presxents, IVC'd, with paranoia and aggressive behavior.  At d/c, he will return home to [place] with his mother and follow up at [facility]. While here, Scott Atkins can benefit from crises stabilization, medication management, therapeutic milieu and referral for services.  Scott Atkins. 05/01/2018

## 2018-05-01 NOTE — Progress Notes (Signed)
Recreation Therapy Notes  INPATIENT RECREATION THERAPY ASSESSMENT  Patient Details Name: Scott Atkins MRN: [id number] DOB: 10/19/1985 Today's Date: 05/01/2018       Information Obtained From:    Able to Participate in Assessment/Interview:    Patient Presentation: Responsive, Withdrawn  Reason for Admission (Per Patient): Patient Unable to Identify  Patient Stressors: Family, Other (Comment)("mother and relationship with her, not being able to move, and when people tell me what to do")  Coping Skills:   Isolation, Sports, Arguments, Music, Substance Abuse  Leisure Interests (2+):  Individual - Other (Comment)("hangout at my uncle's house, it's personal")  Frequency of Recreation/Participation:    Awareness of Community Resources:  (When patient was asked where he goes in the community, he says "everywhere".)  Community Resources:     Current Use:    If no, Barriers?:    Expressed Interest in [employer] Information:    [place] of Residence:  [employer]  Patient Main Form of Transportation: Car(Patient states his mother drives him around.)  Patient Strengths:  "I am a cool guys that is it"  Patient Identified Areas of Improvement:  "my attitude I guess, Taking people seriously"  Patient Goal for Hospitalization:  "Go ahead and get what they are trying to offer me and go on and get gone"  Current SI (including self-harm):  No  Current HI:  No  Current AVH: No  Staff Intervention Plan: Group Attendance  Consent to Intern Participation: N/A  Pat Patrick, LRT/CTRS  Lawrence Marse[place] Finleigh Cheong 05/01/2018, 1:09 PM

## 2018-05-01 NOTE — Progress Notes (Signed)
Nursing Progress Note: 7p-7a D: Pt currently presents with a anxious/depressed affect and behavior. Pt states "I really don't want to be here. How do I get out of here? I don't need to be here." Interacting minimally with the milieu. Pt reports good sleep during the previous night with current medication regimen. Pt did attend wrap-up group.  A: Pt provided with medications per providers orders. Pt's labs and vitals were monitored throughout the night. Pt supported emotionally and encouraged to express concerns and questions. Pt educated on medications.  R: Pt's safety ensured with 15 minute and environmental checks. Pt currently denies SI, HI, and AVH. Pt verbally contracts to seek staff if SI,HI, or AVH occurs and to consult with staff before acting on any harmful thoughts. Will continue to monitor.

## 2018-05-01 NOTE — H&P (Signed)
Psychiatric Admission Assessment Adult  Patient Identification: Scott Atkins MRN:  [id number] Date of Evaluation:  05/01/2018 Chief Complaint:  Schizophrenia Principal Diagnosis: Schizophrenia (HCC) Diagnosis:   Patient Active Problem List   Diagnosis Date Noted  . Schizophrenia (HCC) [F20.9] 04/29/2018  . Cigarette nicotine dependence without complication [F17.210] 09/23/2016   History of Present Illness:   Scott Atkins is a 32 y/o M with previous psychiatric history of bipolar I who was admitted on IVC initiated by his mother from [facility] ED where he presented with worsening disorganized behavior, agitation threatening to harm/kill his mother, paranoia that his food has been poisoned, unplanned weight loss, and impulsive behaviors such as jumping out of the vehicle. Pt was not taking medications prior to hospitalization, and he was started on trial of risperdal. He was transferred to [facility] for additional treatment and stabilization.  Upon initial interview, pt shares, "It was getting to the point.Marland KitchenMarland Kitchen I was angered more than anything. My activities flared up. She tricked me into going to the hospital." Pt relates story that his mother feigned an illness to convince pt to go the hospital, but then when he arrived he was informed that he was being placed on IVC. When asked why he thinks his mother did that, pt explains, "Probably the cussing - the activities." Pt explains that there have been increasing frequency and intensity of verbal altercations in the home. He denies any instance of violence. He denies destroying any property around the home, and when asked directly about punching holes in the wall, pt explains it was from moving furniture. He endorses some loss of weight which has been unplanned, but he denies any paranoia or concerns that the food is poisoned or contaminated. He explains that his family may have had a misunderstanding that he was worried about his niece putting toothpaste on his  cupcake as had happened once in the past. He denies symptoms of depression, mania, OCD, and PTSD. He denies SI/HI/AH/VH. He drinks about 1 pint of hard alcohol per week and smokes 1 ppd. He denies other illicit substance use.  Discussed with patient about treatment options. He is currently taking no medications, but he recalls previous follow up with Dr. Jannifer Franklin. Pt is in agreement to be continued on risperdal, and he would be open to long-acting injectable as well. Pt is in agreement to continue his current regimen without changes. He had no further questions, comments, or concerns.  Associated Signs/Symptoms: Depression Symptoms:  depressed mood, fatigue, anxiety, (Hypo) Manic Symptoms:  Delusions, Distractibility, Impulsivity, Anxiety Symptoms:  Excessive Worry, Psychotic Symptoms:  Delusions, Paranoia, PTSD Symptoms: NA Total Time spent with patient: 1 hour  Past Psychiatric History:   -previous dx of bipolar - outpt follow up with Dr. Jannifer Franklin - no hx of inpatient treatment - no hx of suicide attempt  Is the patient at risk to self? Yes.    Has the patient been a risk to self in the past 6 months? Yes.    Has the patient been a risk to self within the distant past? Yes.    Is the patient a risk to others? Yes.    Has the patient been a risk to others in the past 6 months? Yes.    Has the patient been a risk to others within the distant past? Yes.     Prior Inpatient Therapy:   Prior Outpatient Therapy:    Alcohol Screening: 1. How often do you have a drink containing alcohol?: Monthly or less  2. How many drinks containing alcohol do you have on a typical day when you are drinking?: 3 or 4 3. How often do you have six or more drinks on one occasion?: Less than monthly AUDIT-C Score: 3 4. How often during the last year have you found that you were not able to stop drinking once you had started?: Less than monthly 5. How often during the last year have you failed to do what  was normally expected from you becasue of drinking?: Never 6. How often during the last year have you needed a first drink in the morning to get yourself going after a heavy drinking session?: Less than monthly 7. How often during the last year have you had a feeling of guilt of remorse after drinking?: Never 8. How often during the last year have you been unable to remember what happened the night before because you had been drinking?: Less than monthly 9. Have you or someone else been injured as a result of your drinking?: No 10. Has a relative or friend or a doctor or another health worker been concerned about your drinking or suggested you cut down?: Yes, but not in the last year Alcohol Use Disorder Identification Test Final Score (AUDIT): 8 Intervention/Follow-up: Alcohol Education Substance Abuse History in the last 12 months:  Yes.   Consequences of Substance Abuse: Medical Consequences:  worsened mood symptoms Previous Psychotropic Medications: Yes  Psychological Evaluations: Yes  Past Medical History:  Past Medical History:  Diagnosis Date  . Mood disorder (HCC)    pt says he was never told dx by psychiatrist    Past Surgical History:  Procedure Laterality Date  . TYMPANOSTOMY TUBE PLACEMENT     Family History:  Family History  Problem Relation Age of Onset  . Heart attack Maternal Grandfather    Family Psychiatric  History:unknown family psychiatric history Tobacco Screening: Have you used any form of tobacco in the last 30 days? (Cigarettes, Smokeless Tobacco, Cigars, and/or Pipes): Yes Tobacco use, Select all that apply: 5 or more cigarettes per day Are you interested in Tobacco Cessation Medications?: No, patient refused Counseled patient on smoking cessation including recognizing danger situations, developing coping skills and basic information about quitting provided: Refused/Declined practical counseling Social History: Pt was born and raised in [place]. He lives  with his mother and niece in [place]. He completed his GED. He is not working, and he last worked about 2.5 years ago, and he supports himself with inheritance from his grandmother. He has no children. He denies legal and trauma history. Social History   Substance and Sexual Activity  Alcohol Use Yes   Comment: "drinks rarely"     Social History   Substance and Sexual Activity  Drug Use Yes  . Types: Marijuana   Comment: occassionally    Additional Social History:      Pain Medications: See MAR Prescriptions: See MAR Over the Counter: See MAR History of alcohol / drug use?: Yes Name of Substance 1: Marijuana. 1 - Age of First Use: UTA 1 - Amount (size/oz): Pt denies use. Pt's UDS is postive for marijuana.  1 - Frequency: UTA 1 - Duration: UTA 1 - Last Use / Amount: UTA Name of Substance 2: Cigarettes.  2 - Age of First Use: UTA 2 - Amount (size/oz): Pt reported, smoking a pack of cigarettes, daily.  2 - Frequency: Daily.  2 - Duration: Ongoing.  2 - Last Use / Amount: Daily.  Allergies:   Allergies  Allergen Reactions  . Penicillins Hives    .Has patient had a PCN reaction causing immediate rash, facial/tongue/throat swelling, SOB or lightheadedness with hypotension: Yes as patient had a PCN reaction causing severe rash involving mucus membranes or skin necrosis: No Has patient had a PCN reaction that required hospitalization: Yes Has patient had a PCN reaction occurring within the last 10 years: No/ If all of the above answers are "NO", then may proceed with Cephalosporin use.    Lab Results: No results found for this or any previous visit (from the past 48 hour(s)).  Blood Alcohol level:  Lab Results  Component Value Date   ETH <10 04/29/2018    Metabolic Disorder Labs:  No results found for: HGBA1C, MPG No results found for: PROLACTIN Lab Results  Component Value Date   CHOL 160 12/05/2017   TRIG 45 12/05/2017   HDL 38 (L) 12/05/2017    CHOLHDL 4.2 12/05/2017   VLDL 9 12/05/2017   LDLCALC 113 (H) 12/05/2017    Current Medications: Current Facility-Administered Medications  Medication Dose Route Frequency Provider Last Rate Last Dose  . acetaminophen (TYLENOL) tablet 650 mg  650 mg Oral Q6H PRN Money, Gerlene Burdock, FNP      . alum & mag hydroxide-simeth (MAALOX/MYLANTA) 200-200-20 MG/5ML suspension 30 mL  30 mL Oral Q4H PRN Money, Feliz Beam B, FNP      . diphenhydrAMINE (BENADRYL) capsule 50 mg  50 mg Oral Q6H PRN Money, Gerlene Burdock, FNP       Or  . diphenhydrAMINE (BENADRYL) injection 50 mg  50 mg Intramuscular Q6H PRN Money, Feliz Beam B, FNP      . haloperidol (HALDOL) tablet 5 mg  5 mg Oral Q6H PRN Money, Feliz Beam B, FNP       Or  . haloperidol lactate (HALDOL) injection 5 mg  5 mg Intramuscular Q6H PRN Money, Gerlene Burdock, FNP      . hydrOXYzine (ATARAX/VISTARIL) tablet 25 mg  25 mg Oral TID PRN Money, Gerlene Burdock, FNP      . LORazepam (ATIVAN) tablet 2 mg  2 mg Oral Q6H PRN Money, Gerlene Burdock, FNP   2 mg at 04/30/18 2308   Or  . LORazepam (ATIVAN) injection 2 mg  2 mg Intramuscular Q6H PRN Money, Feliz Beam B, FNP      . magnesium hydroxide (MILK OF MAGNESIA) suspension 30 mL  30 mL Oral Daily PRN Money, Gerlene Burdock, FNP      . risperiDONE (RISPERDAL) tablet 1 mg  1 mg Oral BID Money, Gerlene Burdock, FNP   1 mg at 05/01/18 0747  . traZODone (DESYREL) tablet 50 mg  50 mg Oral QHS PRN Money, Gerlene Burdock, FNP       PTA Medications: Medications Prior to Admission  Medication Sig Dispense Refill Last Dose  . LORazepam (ATIVAN) 0.5 MG tablet Take 0.5 mg by mouth 2 (two) times daily as needed for anxiety.   Not Taking at Unknown time  . Multiple Vitamin (MULTIVITAMIN WITH MINERALS) TABS tablet Take 1 tablet by mouth daily.   Past Week at Unknown time  . perphenazine (TRILAFON) 4 MG tablet Take 4 mg by mouth at bedtime.   Not Taking at Unknown time    Musculoskeletal: Strength & Muscle Tone: within normal limits Gait & Station: normal Patient leans:  N/A  Psychiatric Specialty Exam: Physical Exam  Nursing note and vitals reviewed.   Review of Systems  Constitutional: Negative for chills and fever.  Respiratory: Negative  for cough and shortness of breath.   Cardiovascular: Negative for chest pain.  Gastrointestinal: Negative for abdominal pain, heartburn, nausea and vomiting.  Psychiatric/Behavioral: Negative for depression, hallucinations and suicidal ideas. The patient is not nervous/anxious and does not have insomnia.     Blood pressure 102/75, pulse 84, temperature 98.2 F (36.8 C), temperature source Oral, resp. rate 18, height 5\' 11"  (1.803 m), weight 106.1 kg, SpO2 100 %.Body mass index is 32.64 kg/m.  General Appearance: Casual and Disheveled  Eye Contact:  Good  Speech:  Clear and Coherent and Normal Rate  Volume:  Normal  Mood:  Anxious and Euthymic  Affect:  Appropriate and Congruent  Thought Process:  Goal Directed  Orientation:  Full (Time, Place, and Person)  Thought Content:  Logical  Suicidal Thoughts:  No  Homicidal Thoughts:  No  Memory:  Immediate;   Fair Recent;   Fair Remote;   Fair  Judgement:  Fair  Insight:  Fair  Psychomotor Activity:  Normal  Concentration:  Concentration: Fair  Recall:  [employer] of Knowledge:  Fair  Language:  Fair  Akathisia:  No  Handed:    AIMS (if indicated):     Assets:  Communication Skills Resilience Social Support  ADL's:  Intact  Cognition:  WNL  Sleep:  Number of Hours: 4.5   Treatment Plan Summary: Daily contact with patient to assess and evaluate symptoms and progress in treatment and Medication management  Observation Level/Precautions:  15 minute checks  Laboratory:  CBC Chemistry Profile HbAIC UDS UA  Psychotherapy:  Encourage participation in groups and therapeutic milieu   Medications:  Continue risperdal 1mg  po BID. Continue ativan 2mg  po/IM q6h prn agitation. Continue haldol 5mg  po/IM q6h prn agitation. Continue benadryl 50mg  po/IM q6h prn  agitation. Continue vistaril 25mg  po TID prn anxiety. Continue trazodone 50mg  po qhs prn insomnia.  Consultations:    Discharge Concerns:    Estimated LOS: 5-7 days  Other:     Physician Treatment Plan for Primary Diagnosis: Schizophrenia (HCC) Long Term Goal(s): Improvement in symptoms so as ready for discharge  Short Term Goals: Ability to demonstrate self-control will improve  Physician Treatment Plan for Secondary Diagnosis: Principal Problem:   Schizophrenia (HCC)  Long Term Goal(s): Improvement in symptoms so as ready for discharge  Short Term Goals: Compliance with prescribed medications will improve  I certify that inpatient services furnished can reasonably be expected to improve the patient's condition.    Micheal Likens, MD 9/2/201910:24 AM

## 2018-05-02 DIAGNOSIS — R451 Restlessness and agitation: Secondary | ICD-10-CM

## 2018-05-02 DIAGNOSIS — G4700 Insomnia, unspecified: Secondary | ICD-10-CM

## 2018-05-02 DIAGNOSIS — F17290 Nicotine dependence, other tobacco product, uncomplicated: Secondary | ICD-10-CM

## 2018-05-02 MED ORDER — RISPERIDONE 2 MG PO TABS
2.0000 mg | ORAL_TABLET | Freq: Two times a day (BID) | ORAL | Status: DC
  Administered 2018-05-02 – 2018-05-08 (×12): 2 mg via ORAL
  Filled 2018-05-02 (×13): qty 1
  Filled 2018-05-02: qty 14
  Filled 2018-05-02 (×4): qty 1
  Filled 2018-05-02: qty 14

## 2018-05-02 NOTE — Progress Notes (Signed)
Recreation Therapy Notes  Date: 9.3.19 Time: 1000 Location: 500 Hall Dayroom  Group Topic: Communication, Team Building, Problem Solving  Goal Area(s) Addresses:  Patient will effectively work with peer towards shared goal.  Patient will identify skill used to make activity successful.  Patient will identify how skills used during activity can be used to reach post d/c goals.   Intervention: STEM Activity   Activity: In team's, using 10 red plastic cups, patients were asked to stack the cups into a pyramid using the rubber band with the strings attached.    Education: Pharmacist, community, Building control surveyor.   Education Outcome: Acknowledges education/In group clarification offered/Needs additional education.   Clinical Observations/Feedback: Pt did not attend group.    Caroll Rancher, LRT/CTRS     Caroll Rancher A 05/02/2018 12:06 PM

## 2018-05-02 NOTE — Progress Notes (Signed)
Patient denies SI, HI and AVH this shift.  Patient has been compliant with medications.  Patient has attended groups and engaged in unit activities.   Assess patient for safety, offer medications as prescribed, engage patient in 1:1 staff talks.  Continue to monitor as planned. Patient able to contract for safety.  

## 2018-05-02 NOTE — [facility] Suicide Risk Assessment (Signed)
[facility] INPATIENT:  Family/Significant Other Suicide Prevention Education  Suicide Prevention Education:  Education Completed; Demetris Mcclenahan, mother, [phone] has been identified by the patient as the family member/significant other with whom the patient will be residing, and identified as the person(s) who will aid the patient in the event of a mental health crisis (suicidal ideations/suicide attempt).  With written consent from the patient, the family member/significant other has been provided the following suicide prevention education, prior to the and/or following the discharge of the patient.  The suicide prevention education provided includes the following:  Suicide risk factors  Suicide prevention and interventions  National Suicide Hotline telephone number  [facility] assessment telephone number  [facility] Emergency Assistance 911  [facility] and/or Residential Mobile Crisis Unit telephone number  Request made of family/significant other to:  Remove weapons (e.g., guns, rifles, knives), all items previously/currently identified as safety concern.    Remove drugs/medications (over-the-counter, prescriptions, illicit drugs), all items previously/currently identified as a safety concern.  The family member/significant other verbalizes understanding of the suicide prevention education information provided.  The family member/significant other agrees to remove the items of safety concern listed above. The patient did not endorse SI at the time of admission, nor did the patient c/o SI during the stay here.  SPE not required. However, I did talk to mother about crises plan, treatment team recommendations and applying for disability.  Ida Rogue 05/02/2018, 2:09 PM

## 2018-05-02 NOTE — [facility] Group Notes (Signed)
LCSW Group Therapy Note   05/02/2018 1:15pm   Type of Therapy and Topic:  Group Therapy:  Overcoming Obstacles   Participation Level:  Did Not Attend   Description of Group:    In this group patients will be encouraged to explore what they see as obstacles to their own wellness and recovery. They will be guided to discuss their thoughts, feelings, and behaviors related to these obstacles. The group will process together ways to cope with barriers, with attention given to specific choices patients can make. Each patient will be challenged to identify changes they are motivated to make in order to overcome their obstacles. This group will be process-oriented, with patients participating in exploration of their own experiences as well as giving and receiving support and challenge from other group members.   Therapeutic Goals: 1. Patient will identify personal and current obstacles as they relate to admission. 2. Patient will identify barriers that currently interfere with their wellness or overcoming obstacles.  3. Patient will identify feelings, thought process and behaviors related to these barriers. 4. Patient will identify two changes they are willing to make to overcome these obstacles:      Summary of Patient Progress      Therapeutic Modalities:   Cognitive Behavioral Therapy Solution Focused Therapy Motivational Interviewing Relapse Prevention Therapy  Ida Rogue, LCSW 05/02/2018 1:11 PM

## 2018-05-02 NOTE — Progress Notes (Signed)
[facility] MD Progress Note  05/02/2018 4:36 PM Scott Atkins  MRN:  [id number] Subjective:    Scott Atkins is a 32 y/o M with previous psychiatric history of bipolar I who was admitted on IVC initiated by his mother from [facility] ED where he presented with worsening disorganized behavior, agitation threatening to harm/kill his mother, paranoia that his food has been poisoned, unplanned weight loss, and impulsive behaviors such as jumping out of the vehicle. Pt was not taking medications prior to hospitalization, and he was started on trial of risperdal. He was transferred to [facility] for additional treatment and stabilization.  Today upon evaluation, pt shares, "I'm doing alright." He denies any specific concerns. He is sleeping well. His appetite is good. He denies other physical complaints. He denies SI/HI/AH/VH. He is tolerating his medications well. Discussed with patient about concerns of outbursts at home, and pt denies punching wall/refridgerator, and he also continues to assert that he has not been paranoid about the food being poisoned. We discussed about plan to titrate up his dose of risperdal and transition to long-acting injectable form, and pt was in agreement.   Principal Problem: Schizophrenia (HCC) Diagnosis:   Patient Active Problem List   Diagnosis Date Noted  . Schizophrenia (HCC) [F20.9] 04/29/2018  . Cigarette nicotine dependence without complication [F17.210] 09/23/2016   Total Time spent with patient: 30 minutes  Past Psychiatric History: see H&P  Past Medical History:  Past Medical History:  Diagnosis Date  . Mood disorder (HCC)    pt says he was never told dx by psychiatrist    Past Surgical History:  Procedure Laterality Date  . TYMPANOSTOMY TUBE PLACEMENT     Family History:  Family History  Problem Relation Age of Onset  . Heart attack Maternal Grandfather    Family Psychiatric  History: see H&P Social History:  Social History   Substance and Sexual Activity   Alcohol Use Yes   Comment: "drinks rarely"     Social History   Substance and Sexual Activity  Drug Use Yes  . Types: Marijuana   Comment: occassionally    Social History   Socioeconomic History  . Marital status: Single    Spouse name: Not on file  . Number of children: Not on file  . Years of education: Not on file  . Highest education level: Not on file  Occupational History  . Not on file  Social Needs  . Financial resource strain: Not on file  . Food insecurity:    Worry: Not on file    Inability: Not on file  . Transportation needs:    Medical: Not on file    Non-medical: Not on file  Tobacco Use  . Smoking status: Current Every Day Smoker    Packs/day: 0.25    Years: 12.00    Pack years: 3.00    Types: Cigarettes, Cigars  . Smokeless tobacco: Never Used  Substance and Sexual Activity  . Alcohol use: Yes    Comment: "drinks rarely"  . Drug use: Yes    Types: Marijuana    Comment: occassionally  . Sexual activity: Not on file  Lifestyle  . Physical activity:    Days per week: Not on file    Minutes per session: Not on file  . Stress: Not on file  Relationships  . Social connections:    Talks on phone: Not on file    Gets together: Not on file    Attends religious service: Not on file  Active member of club or organization: Not on file    Attends meetings of clubs or organizations: Not on file    Relationship status: Not on file  Other Topics Concern  . Not on file  Social History Narrative  . Not on file   Additional Social History:    Pain Medications: See MAR Prescriptions: See MAR Over the Counter: See MAR History of alcohol / drug use?: Yes Name of Substance 1: Marijuana. 1 - Age of First Use: UTA 1 - Amount (size/oz): Pt denies use. Pt's UDS is postive for marijuana.  1 - Frequency: UTA 1 - Duration: UTA 1 - Last Use / Amount: UTA Name of Substance 2: Cigarettes.  2 - Age of First Use: UTA 2 - Amount (size/oz): Pt reported,  smoking a pack of cigarettes, daily.  2 - Frequency: Daily.  2 - Duration: Ongoing.  2 - Last Use / Amount: Daily.                 Sleep: Good  Appetite:  Good  Current Medications: Current Facility-Administered Medications  Medication Dose Route Frequency Provider Last Rate Last Dose  . acetaminophen (TYLENOL) tablet 650 mg  650 mg Oral Q6H PRN Money, Gerlene Burdock, FNP      . alum & mag hydroxide-simeth (MAALOX/MYLANTA) 200-200-20 MG/5ML suspension 30 mL  30 mL Oral Q4H PRN Money, Feliz Beam B, FNP      . diphenhydrAMINE (BENADRYL) capsule 50 mg  50 mg Oral Q6H PRN Money, Gerlene Burdock, FNP       Or  . diphenhydrAMINE (BENADRYL) injection 50 mg  50 mg Intramuscular Q6H PRN Money, Feliz Beam B, FNP      . haloperidol (HALDOL) tablet 5 mg  5 mg Oral Q6H PRN Money, Feliz Beam B, FNP       Or  . haloperidol lactate (HALDOL) injection 5 mg  5 mg Intramuscular Q6H PRN Money, Gerlene Burdock, FNP      . hydrOXYzine (ATARAX/VISTARIL) tablet 25 mg  25 mg Oral TID PRN Money, Gerlene Burdock, FNP      . LORazepam (ATIVAN) tablet 2 mg  2 mg Oral Q6H PRN Money, Gerlene Burdock, FNP   2 mg at 04/30/18 2308   Or  . LORazepam (ATIVAN) injection 2 mg  2 mg Intramuscular Q6H PRN Money, Feliz Beam B, FNP      . magnesium hydroxide (MILK OF MAGNESIA) suspension 30 mL  30 mL Oral Daily PRN Money, Gerlene Burdock, FNP      . risperiDONE (RISPERDAL) tablet 1 mg  1 mg Oral BID Money, Gerlene Burdock, FNP   1 mg at 05/02/18 0800  . traZODone (DESYREL) tablet 50 mg  50 mg Oral QHS PRN Money, Gerlene Burdock, FNP        Lab Results: No results found for this or any previous visit (from the past 48 hour(s)).  Blood Alcohol level:  Lab Results  Component Value Date   ETH <10 04/29/2018    Metabolic Disorder Labs: No results found for: HGBA1C, MPG No results found for: PROLACTIN Lab Results  Component Value Date   CHOL 160 12/05/2017   TRIG 45 12/05/2017   HDL 38 (L) 12/05/2017   CHOLHDL 4.2 12/05/2017   VLDL 9 12/05/2017   LDLCALC 113 (H) 12/05/2017     Physical Findings: AIMS: Facial and Oral Movements Muscles of Facial Expression: None, normal Lips and Perioral Area: None, normal Jaw: None, normal Tongue: None, normal,Extremity Movements Upper (arms, wrists, hands, fingers): None, normal Lower (  legs, knees, ankles, toes): None, normal, Trunk Movements Neck, shoulders, hips: None, normal, Overall Severity Severity of abnormal movements (highest score from questions above): None, normal Incapacitation due to abnormal movements: None, normal Patient's awareness of abnormal movements (rate only patient's report): No Awareness, Dental Status Current problems with teeth and/or dentures?: No Does patient usually wear dentures?: No  CIWA:  CIWA-Ar Total: 2 COWS:  COWS Total Score: 1  Musculoskeletal: Strength & Muscle Tone: within normal limits Gait & Station: normal Patient leans: N/A  Psychiatric Specialty Exam: Physical Exam  Nursing note and vitals reviewed.   Review of Systems  Constitutional: Negative for chills and fever.  Respiratory: Negative for cough and shortness of breath.   Cardiovascular: Negative for chest pain.  Gastrointestinal: Negative for abdominal pain, heartburn, nausea and vomiting.  Psychiatric/Behavioral: Negative for depression, hallucinations and suicidal ideas. The patient is not nervous/anxious and does not have insomnia.     Blood pressure 105/75, pulse 65, temperature 98.1 F (36.7 C), temperature source Oral, resp. rate 18, height 5\' 11"  (1.803 m), weight 106.1 kg, SpO2 100 %.Body mass index is 32.64 kg/m.  General Appearance: Casual  Eye Contact:  Good  Speech:  Clear and Coherent and Normal Rate  Volume:  Normal  Mood:  Euthymic  Affect:  Appropriate, Congruent and Flat  Thought Process:  Coherent and Goal Directed  Orientation:  Full (Time, Place, and Person)  Thought Content:  Logical  Suicidal Thoughts:  No  Homicidal Thoughts:  No  Memory:  Immediate;   Fair Recent;    Fair Remote;   Fair  Judgement:  Fair  Insight:  Fair  Psychomotor Activity:  Normal  Concentration:  Concentration: Fair  Recall:  [employer] of Knowledge:  Fair  Language:  Fair  Akathisia:  No  Handed:    AIMS (if indicated):     Assets:  Resilience Social Support  ADL's:  Intact  Cognition:  WNL  Sleep:  Number of Hours: 4.25   Treatment Plan Summary: Daily contact with patient to assess and evaluate symptoms and progress in treatment and Medication management   -Continue inpatient hospitalization  -Schizophrenia   -Change risperdal 1mg  po BID to risperdal 2mg  po BID  -Anxiety   -Continue vistaril 25mg  po TID prn anxiety  -agitation    -Continue benadryl 50mg  po/IM q6h prn agitation   - Continue haldol 5mg  po/IM q6h prn agitation   -Continue ativan 2mg  po/IM q6h prn agitation  -insomnia  -Continue trazodone 50mg  po qhs prn insomnia  -Encourage participation in groups and therapeutic milieu  -disposition planning will be ongoing  Micheal Likens, MD 05/02/2018, 4:36 PM

## 2018-05-03 MED ORDER — RISPERIDONE MICROSPHERES 25 MG IM SUSR
25.0000 mg | INTRAMUSCULAR | Status: DC
  Administered 2018-05-03: 25 mg via INTRAMUSCULAR
  Filled 2018-05-03: qty 2

## 2018-05-03 NOTE — Progress Notes (Signed)
Adult Psychoeducational Group Note  Date:  05/03/2018 Time:  10:13 PM  Group Topic/Focus:  Wrap-Up Group:   The focus of this group is to help patients review their daily goal of treatment and discuss progress on daily workbooks.  Participation Level:  Minimal  Participation Quality:  Appropriate  Affect:  Appropriate  Cognitive:  Oriented  Insight: Appropriate  Engagement in Group:  Engaged  Modes of Intervention:  Socialization and Support  Additional Comments:  Patient attended and participated in group tonight. He reports having a OK day. Today he slept, went for meals and attended groups. Is schedule to leave on Friday.  Lita Mains [facility] 05/03/2018, 10:13 PM

## 2018-05-03 NOTE — Progress Notes (Signed)
Nursing Progress Note: 7p-7a D: Pt currently presents with a anxious/flat affect and behavior. Pt states "I am fine. I want to go home." Interacting appropriately with the milieu. Pt reports good sleep during the previous night with current medication regimen. Pt did attend wrap-up group.  A: Pt provided with medications per providers orders. Pt's labs and vitals were monitored throughout the night. Pt supported emotionally and encouraged to express concerns and questions. Pt educated on medications.  R: Pt's safety ensured with 15 minute and environmental checks. Pt currently denies SI, HI, and AVH. Pt verbally contracts to seek staff if SI,HI, or AVH occurs and to consult with staff before acting on any harmful thoughts. Will continue to monitor.

## 2018-05-03 NOTE — [facility] Group Notes (Signed)
LCSW Group Therapy Note  05/03/2018 1:15pm    Type of Therapy and Topic:  Group Therapy:  Who Am I?  Self Esteem, Self-Actualization and Understanding Self    Participation Level:  Did Not Attend  Description of Group:    In this group patients will be asked to explore values, beliefs, truths, and morals as they relate to personal self.  Patients will be guided to discuss their thoughts, feelings, and behaviors related to what they identify as important to their true self. Patients will process together how values, beliefs and truths are connected to specific choices patients make every day. Each patient will be challenged to identify changes that they are motivated to make in order to improve self-esteem and self-actualization. This group will be process-oriented, with patients participating in exploration of their own experiences, giving and receiving support, and processing challenge from other group members.   Therapeutic Goals: 1. Patient will identify false beliefs that currently interfere with their self-esteem.  2. Patient will identify feelings, thought process, and behaviors related to self and will become aware of the uniqueness of themselves and of others.  3. Patient will be able to identify and verbalize values, morals, and beliefs as they relate to self. 4. Patient will begin to learn how to build self-esteem/self-awareness by expressing what is important and unique to them personally.   Summary of Patient Progress      Therapeutic Modalities:   Cognitive Behavioral Therapy Solution Focused Therapy Motivational Interviewing Brief Therapy   Ida Rogue, LCSW 05/03/2018 2:00 PM

## 2018-05-03 NOTE — Progress Notes (Signed)
Nursing Progress Note: 7p-7a D: Pt currently presents with a anxious/depressed/paranoid/concrete affect and behavior. Pt states "I don't need to be here. There's nothing wrong with me." Interacting minimally with the milieu. Pt reports good sleep during the previous night with current medication regimen. Pt did attend wrap-up group.  A: Pt provided with medications per providers orders. Pt's labs and vitals were monitored throughout the night. Pt supported emotionally and encouraged to express concerns and questions. Pt educated on medications.  R: Pt's safety ensured with 15 minute and environmental checks. Pt currently denies SI, HI, and AVH. Pt verbally contracts to seek staff if SI,HI, or AVH occurs and to consult with staff before acting on any harmful thoughts. Will continue to monitor.

## 2018-05-03 NOTE — Progress Notes (Signed)
Recreation Therapy Notes  Date: 9.4.19 Time: 1000 Location: [address] Dayroom  Group Topic: Coping Skills  Goal Area(s) Addresses:  Patient will be able to identify positive coping skills. Patient will be able to identify benefits of using coping skills post d/c.  Intervention: [employer], dry erase marker, worksheet  Activity: Coping Skills Mindmap.  LRT introduced coping skills to group.  LRT and patients filled in the first 8 boxes of their mind map with anger, finances, sadness, stress, poor communication, anxiety, suicidal thoughts and depression.  Patients were to then come up with at least 3 coping skills for each situation.  Education: Pharmacologist, Building control surveyor.   Education Outcome: Acknowledges understanding/In group clarification offered/Needs additional education.   Clinical Observations/Feedback: Patient did not attend group.    Caroll Rancher, LRT/CTRS     Caroll Rancher A 05/03/2018 12:09 PM

## 2018-05-03 NOTE — Progress Notes (Signed)
[facility] MD Progress Note  05/03/2018 11:47 AM Scott Atkins  MRN:  [id number]  Subjective: Scott Atkins reports, "I'm doing alright. My mood is good. I have no depression symptoms. I feel good. Why am I still here? I'm ready to be discharged. I'm ready to get out of here, get some fresh air. I don't like going to the cafeteria to eat because I do not like the set up. I'm no longer in high school. It reminds of high school cafeteria. I don't like the idea of lining up to go to the cafeteria. I'm an adult. I can handle myself. These are my reasons for not going to the cafeteria to eat. I'm taking my medicines, no side effects. It was my mother who went to court to sign papers to get me here against my will. I have learned to not mess with a black woman".   Scott Atkins is a 32 y/o M with previous psychiatric history of bipolar I who was admitted on IVC initiated by his mother from [facility] ED where he presented with worsening disorganized behavior, agitation threatening to harm/kill his mother, paranoia that his food has been poisoned, unplanned weight loss, and impulsive behaviors such as jumping out of the vehicle. Pt was not taking medications prior to hospitalization, and he was started on trial of risperdal. He was transferred to [facility] for additional treatment and stabilization.  Today upon evaluation, pt shares, "I'm doing alright." He denies any specific concerns. He is sleeping well. His appetite is good. He denies other physical complaints. He denies SI/HI/AH/VH. He is tolerating his medications well. He is asked about when he will be discharged. He thinks he is ready to be discharged. He adds that the reason that he does not want to go to the cafeteria to eat is because, he does not like the set up. He says it reminds him of the high school cafeteria. He also says he does not like to line-up to go to the cafeteria for meals because he is no longer a kid. The attending psychiatrist had previously discussed with  patient about concerns of outbursts at home, and pt denied punching wall/refridgerator and he also continued to assert that he has not been paranoid about the food being poisoned. The patient & the psychiatrist had discussed the plan to titrate up his dose of risperdal and transition to long-acting injectable form and pt was in agreement. He does not appear to be in any apparent distress.   Principal Problem: Schizophrenia (HCC) Diagnosis:   Patient Active Problem List   Diagnosis Date Noted  . Schizophrenia (HCC) [F20.9] 04/29/2018  . Cigarette nicotine dependence without complication [F17.210] 09/23/2016   Total Time spent with patient: 15 minutes  Past Psychiatric History: See H&P  Past Medical History:  Past Medical History:  Diagnosis Date  . Mood disorder (HCC)    pt says he was never told dx by psychiatrist    Past Surgical History:  Procedure Laterality Date  . TYMPANOSTOMY TUBE PLACEMENT     Family History:  Family History  Problem Relation Age of Onset  . Heart attack Maternal Grandfather    Family Psychiatric  History: See H&P  Social History:  Social History   Substance and Sexual Activity  Alcohol Use Yes   Comment: "drinks rarely"     Social History   Substance and Sexual Activity  Drug Use Yes  . Types: Marijuana   Comment: occassionally    Social History   Socioeconomic History  .  Marital status: Single    Spouse name: Not on file  . Number of children: Not on file  . Years of education: Not on file  . Highest education level: Not on file  Occupational History  . Not on file  Social Needs  . Financial resource strain: Not on file  . Food insecurity:    Worry: Not on file    Inability: Not on file  . Transportation needs:    Medical: Not on file    Non-medical: Not on file  Tobacco Use  . Smoking status: Current Every Day Smoker    Packs/day: 0.25    Years: 12.00    Pack years: 3.00    Types: Cigarettes, Cigars  . Smokeless tobacco:  Never Used  Substance and Sexual Activity  . Alcohol use: Yes    Comment: "drinks rarely"  . Drug use: Yes    Types: Marijuana    Comment: occassionally  . Sexual activity: Not on file  Lifestyle  . Physical activity:    Days per week: Not on file    Minutes per session: Not on file  . Stress: Not on file  Relationships  . Social connections:    Talks on phone: Not on file    Gets together: Not on file    Attends religious service: Not on file    Active member of club or organization: Not on file    Attends meetings of clubs or organizations: Not on file    Relationship status: Not on file  Other Topics Concern  . Not on file  Social History Narrative  . Not on file   Additional Social History:  Pain Medications: See MAR Prescriptions: See MAR Over the Counter: See MAR History of alcohol / drug use?: Yes Name of Substance 1: Marijuana. 1 - Age of First Use: UTA 1 - Amount (size/oz): Pt denies use. Pt's UDS is postive for marijuana.  1 - Frequency: UTA 1 - Duration: UTA 1 - Last Use / Amount: UTA Name of Substance 2: Cigarettes.  2 - Age of First Use: UTA 2 - Amount (size/oz): Pt reported, smoking a pack of cigarettes, daily.  2 - Frequency: Daily.  2 - Duration: Ongoing.  2 - Last Use / Amount: Daily.   Sleep: Good  Appetite:  Good  Current Medications: Current Facility-Administered Medications  Medication Dose Route Frequency Provider Last Rate Last Dose  . acetaminophen (TYLENOL) tablet 650 mg  650 mg Oral Q6H PRN Money, Gerlene Burdock, FNP      . alum & mag hydroxide-simeth (MAALOX/MYLANTA) 200-200-20 MG/5ML suspension 30 mL  30 mL Oral Q4H PRN Money, Feliz Beam B, FNP      . diphenhydrAMINE (BENADRYL) capsule 50 mg  50 mg Oral Q6H PRN Money, Gerlene Burdock, FNP       Or  . diphenhydrAMINE (BENADRYL) injection 50 mg  50 mg Intramuscular Q6H PRN Money, Feliz Beam B, FNP      . haloperidol (HALDOL) tablet 5 mg  5 mg Oral Q6H PRN Money, Feliz Beam B, FNP       Or  . haloperidol  lactate (HALDOL) injection 5 mg  5 mg Intramuscular Q6H PRN Money, Gerlene Burdock, FNP      . hydrOXYzine (ATARAX/VISTARIL) tablet 25 mg  25 mg Oral TID PRN Money, Gerlene Burdock, FNP      . LORazepam (ATIVAN) tablet 2 mg  2 mg Oral Q6H PRN Money, Gerlene Burdock, FNP   2 mg at 04/30/18 2308   Or  .  LORazepam (ATIVAN) injection 2 mg  2 mg Intramuscular Q6H PRN Money, Feliz Beam B, FNP      . magnesium hydroxide (MILK OF MAGNESIA) suspension 30 mL  30 mL Oral Daily PRN Money, Gerlene Burdock, FNP      . risperiDONE (RISPERDAL) tablet 2 mg  2 mg Oral BID Micheal Likens, MD   2 mg at 05/03/18 1478  . traZODone (DESYREL) tablet 50 mg  50 mg Oral QHS PRN Money, Gerlene Burdock, FNP       Lab Results: No results found for this or any previous visit (from the past 48 hour(s)).  Blood Alcohol level:  Lab Results  Component Value Date   ETH <10 04/29/2018   Metabolic Disorder Labs: No results found for: HGBA1C, MPG No results found for: PROLACTIN Lab Results  Component Value Date   CHOL 160 12/05/2017   TRIG 45 12/05/2017   HDL 38 (L) 12/05/2017   CHOLHDL 4.2 12/05/2017   VLDL 9 12/05/2017   LDLCALC 113 (H) 12/05/2017   Physical Findings: AIMS: Facial and Oral Movements Muscles of Facial Expression: None, normal Lips and Perioral Area: None, normal Jaw: None, normal Tongue: None, normal,Extremity Movements Upper (arms, wrists, hands, fingers): None, normal Lower (legs, knees, ankles, toes): None, normal, Trunk Movements Neck, shoulders, hips: None, normal, Overall Severity Severity of abnormal movements (highest score from questions above): None, normal Incapacitation due to abnormal movements: None, normal Patient's awareness of abnormal movements (rate only patient's report): No Awareness, Dental Status Current problems with teeth and/or dentures?: No Does patient usually wear dentures?: No  CIWA:  CIWA-Ar Total: 2 COWS:  COWS Total Score: 1  Musculoskeletal: Strength & Muscle Tone: within normal  limits Gait & Station: normal Patient leans: N/A  Psychiatric Specialty Exam: Physical Exam  Nursing note and vitals reviewed.   Review of Systems  Constitutional: Negative for chills and fever.  Respiratory: Negative for cough and shortness of breath.   Cardiovascular: Negative for chest pain.  Gastrointestinal: Negative for abdominal pain, heartburn, nausea and vomiting.  Psychiatric/Behavioral: Negative for depression, hallucinations and suicidal ideas. The patient is not nervous/anxious and does not have insomnia.     Blood pressure 104/81, pulse 72, temperature 98.2 F (36.8 C), temperature source Oral, resp. rate 18, height 5\' 11"  (1.803 m), weight 106.1 kg, SpO2 100 %.Body mass index is 32.64 kg/m.  General Appearance: Casual  Eye Contact:  Good  Speech:  Clear and Coherent and Normal Rate  Volume:  Normal  Mood:  Euthymic  Affect:  Appropriate, Congruent and Flat  Thought Process:  Coherent and Goal Directed  Orientation:  Full (Time, Place, and Person)  Thought Content:  Logical  Suicidal Thoughts:  No  Homicidal Thoughts:  No  Memory:  Immediate;   Fair Recent;   Fair Remote;   Fair  Judgement:  Fair  Insight:  Fair  Psychomotor Activity:  Normal  Concentration:  Concentration: Fair  Recall:  [employer] of Knowledge:  Fair  Language:  Fair  Akathisia:  No  Handed:    AIMS (if indicated):     Assets:  Resilience Social Support  ADL's:  Intact  Cognition:  WNL  Sleep:  Number of Hours: 1.25   Treatment Plan Summary: Daily contact with patient to assess and evaluate symptoms and progress in treatment and Medication management   -Continue inpatient hospitalization.  -Will continue today 05/03/2018 plan as below except where it is noted.  -Schizophrenia   -Change risperdal 1mg  po BID to  risperdal 2mg  po BID  -Anxiety   -Continue vistaril 25mg  po TID prn anxiety  -agitation    -Continue benadryl 50mg  po/IM q6h prn agitation   -Continue haldol 5mg  po/IM  q6h prn agitation   -Continue ativan 2mg  po/IM q6h prn agitation  -insomnia  -Continue trazodone 50mg  po qhs prn insomnia  -Encourage participation in groups and therapeutic milieu  -disposition planning will be ongoing  Armandina Stammer, NP, PMHNP, FNP-BC. 05/03/2018, 11:47 AMPatient ID: [name], male   DOB: Nov 02, 1985, 32 y.o.   MRN: [id number]

## 2018-05-03 NOTE — Progress Notes (Signed)
Patient denies SI, HI and AVH this shift.  Patient has been compliant with medications.  Patient has attended groups and engaged in unit activities.   Assess patient for safety, offer medications as prescribed, engage patient in 1:1 staff talks.  Continue to monitor as planned. Patient able to contract for safety.  

## 2018-05-04 DIAGNOSIS — F200 Paranoid schizophrenia: Secondary | ICD-10-CM

## 2018-05-04 DIAGNOSIS — F1290 Cannabis use, unspecified, uncomplicated: Secondary | ICD-10-CM

## 2018-05-04 NOTE — Progress Notes (Signed)
[facility] MD Progress Note  05/04/2018 2:24 PM Scott Atkins  MRN:  [id number]  Subjective: Scott Atkins reports, "I'm doing alright. I slept through breakfast because my stomach was messed up. But, I did go to the cafeteria yesterday evening for dinner. I slept great last night. I took my shot yesterday. It is going well. I'm not having any problems".    Scott Atkins is a 32 y/o M with previous psychiatric history of bipolar I who was admitted on IVC initiated by his mother from [facility] ED where he presented with worsening disorganized behavior, agitation threatening to harm/kill his mother, paranoia that his food has been poisoned, unplanned weight loss, and impulsive behaviors such as jumping out of the vehicle. Pt was not taking medications prior to hospitalization, and he was started on trial of risperdal. He was transferred to [facility] for additional treatment and stabilization.  Today upon evaluation, pt shares, "I'm doing alright." He denies any specific concerns. He says he is sleeping well. The staff reports that patient is paranoid about food. Reports indicated that this is the reason he is not coming out of his room for meals. Patient was offered a [place] bar, he accepted it, said thank you & ate it. He was also offered a choice of fruit drink, he chose orange juice. He was provided with orange juice which he accepted & drank some of it.  He denies other physical complaints. He denies SI/HI/AH/VH. He is tolerating his medications well. The attending psychiatrist had previously discussed with patient about concerns of outbursts at home, and pt denied punching wall/refridgerator and he also continued to assert that he has not been paranoid about the food being poisoned. The patient & the psychiatrist had discussed the plan to titrate up his dose of risperdal and transition to long-acting injectable form and pt was in agreement. He received his first dose of the Abilify Consta IM yesterday. He does not appear to be in  any apparent distress.   Principal Problem: Schizophrenia (HCC) Diagnosis:   Patient Active Problem List   Diagnosis Date Noted  . Schizophrenia (HCC) [F20.9] 04/29/2018  . Cigarette nicotine dependence without complication [F17.210] 09/23/2016   Total Time spent with patient: 15 minutes  Past Psychiatric History: See H&P  Past Medical History:  Past Medical History:  Diagnosis Date  . Mood disorder (HCC)    pt says he was never told dx by psychiatrist    Past Surgical History:  Procedure Laterality Date  . TYMPANOSTOMY TUBE PLACEMENT     Family History:  Family History  Problem Relation Age of Onset  . Heart attack Maternal Grandfather    Family Psychiatric  History: See H&P  Social History:  Social History   Substance and Sexual Activity  Alcohol Use Yes   Comment: "drinks rarely"     Social History   Substance and Sexual Activity  Drug Use Yes  . Types: Marijuana   Comment: occassionally    Social History   Socioeconomic History  . Marital status: Single    Spouse name: Not on file  . Number of children: Not on file  . Years of education: Not on file  . Highest education level: Not on file  Occupational History  . Not on file  Social Needs  . Financial resource strain: Not on file  . Food insecurity:    Worry: Not on file    Inability: Not on file  . Transportation needs:    Medical: Not on file  Non-medical: Not on file  Tobacco Use  . Smoking status: Current Every Day Smoker    Packs/day: 0.25    Years: 12.00    Pack years: 3.00    Types: Cigarettes, Cigars  . Smokeless tobacco: Never Used  Substance and Sexual Activity  . Alcohol use: Yes    Comment: "drinks rarely"  . Drug use: Yes    Types: Marijuana    Comment: occassionally  . Sexual activity: Not on file  Lifestyle  . Physical activity:    Days per week: Not on file    Minutes per session: Not on file  . Stress: Not on file  Relationships  . Social connections:    Talks  on phone: Not on file    Gets together: Not on file    Attends religious service: Not on file    Active member of club or organization: Not on file    Attends meetings of clubs or organizations: Not on file    Relationship status: Not on file  Other Topics Concern  . Not on file  Social History Narrative  . Not on file   Additional Social History:  Pain Medications: See MAR Prescriptions: See MAR Over the Counter: See MAR History of alcohol / drug use?: Yes Name of Substance 1: Marijuana. 1 - Age of First Use: UTA 1 - Amount (size/oz): Pt denies use. Pt's UDS is postive for marijuana.  1 - Frequency: UTA 1 - Duration: UTA 1 - Last Use / Amount: UTA Name of Substance 2: Cigarettes.  2 - Age of First Use: UTA 2 - Amount (size/oz): Pt reported, smoking a pack of cigarettes, daily.  2 - Frequency: Daily.  2 - Duration: Ongoing.  2 - Last Use / Amount: Daily.   Sleep: Good  Appetite:  Good  Current Medications: Current Facility-Administered Medications  Medication Dose Route Frequency Provider Last Rate Last Dose  . acetaminophen (TYLENOL) tablet 650 mg  650 mg Oral Q6H PRN Money, Gerlene Burdock, FNP      . alum & mag hydroxide-simeth (MAALOX/MYLANTA) 200-200-20 MG/5ML suspension 30 mL  30 mL Oral Q4H PRN Money, Feliz Beam B, FNP      . diphenhydrAMINE (BENADRYL) capsule 50 mg  50 mg Oral Q6H PRN Money, Gerlene Burdock, FNP       Or  . diphenhydrAMINE (BENADRYL) injection 50 mg  50 mg Intramuscular Q6H PRN Money, Feliz Beam B, FNP      . haloperidol (HALDOL) tablet 5 mg  5 mg Oral Q6H PRN Money, Feliz Beam B, FNP       Or  . haloperidol lactate (HALDOL) injection 5 mg  5 mg Intramuscular Q6H PRN Money, Gerlene Burdock, FNP      . hydrOXYzine (ATARAX/VISTARIL) tablet 25 mg  25 mg Oral TID PRN Money, Gerlene Burdock, FNP      . LORazepam (ATIVAN) tablet 2 mg  2 mg Oral Q6H PRN Money, Gerlene Burdock, FNP   2 mg at 04/30/18 2308   Or  . LORazepam (ATIVAN) injection 2 mg  2 mg Intramuscular Q6H PRN Money, Feliz Beam B, FNP       . magnesium hydroxide (MILK OF MAGNESIA) suspension 30 mL  30 mL Oral Daily PRN Money, Gerlene Burdock, FNP      . risperiDONE (RISPERDAL) tablet 2 mg  2 mg Oral BID Micheal Likens, MD   2 mg at 05/04/18 0753  . risperiDONE microspheres (RISPERDAL CONSTA) injection 25 mg  25 mg Intramuscular Q14 Days Micheal Likens,  MD   25 mg at 05/03/18 1414  . traZODone (DESYREL) tablet 50 mg  50 mg Oral QHS PRN Money, Gerlene Burdock, FNP       Lab Results: No results found for this or any previous visit (from the past 48 hour(s)).  Blood Alcohol level:  Lab Results  Component Value Date   ETH <10 04/29/2018   Metabolic Disorder Labs: No results found for: HGBA1C, MPG No results found for: PROLACTIN Lab Results  Component Value Date   CHOL 160 12/05/2017   TRIG 45 12/05/2017   HDL 38 (L) 12/05/2017   CHOLHDL 4.2 12/05/2017   VLDL 9 12/05/2017   LDLCALC 113 (H) 12/05/2017   Physical Findings: AIMS: Facial and Oral Movements Muscles of Facial Expression: None, normal Lips and Perioral Area: None, normal Jaw: None, normal Tongue: None, normal,Extremity Movements Upper (arms, wrists, hands, fingers): None, normal Lower (legs, knees, ankles, toes): None, normal, Trunk Movements Neck, shoulders, hips: None, normal, Overall Severity Severity of abnormal movements (highest score from questions above): None, normal Incapacitation due to abnormal movements: None, normal Patient's awareness of abnormal movements (rate only patient's report): No Awareness, Dental Status Current problems with teeth and/or dentures?: No Does patient usually wear dentures?: No  CIWA:  CIWA-Ar Total: 2 COWS:  COWS Total Score: 1  Musculoskeletal: Strength & Muscle Tone: within normal limits Gait & Station: normal Patient leans: N/A  Psychiatric Specialty Exam: Physical Exam  Nursing note and vitals reviewed.   Review of Systems  Constitutional: Negative for chills and fever.  Respiratory: Negative for  cough and shortness of breath.   Cardiovascular: Negative for chest pain.  Gastrointestinal: Negative for abdominal pain, heartburn, nausea and vomiting.  Psychiatric/Behavioral: Negative for depression, hallucinations and suicidal ideas. The patient is not nervous/anxious and does not have insomnia.     Blood pressure 99/70, pulse (!) 58, temperature 97.8 F (36.6 C), temperature source Oral, resp. rate 14, height 5\' 11"  (1.803 m), weight 106.1 kg, SpO2 100 %.Body mass index is 32.64 kg/m.  General Appearance: Casual  Eye Contact:  Good  Speech:  Clear and Coherent and Normal Rate  Volume:  Normal  Mood:  Euthymic  Affect:  Appropriate, Congruent and Flat  Thought Process:  Coherent and Goal Directed  Orientation:  Full (Time, Place, and Person)  Thought Content:  Logical  Suicidal Thoughts:  No  Homicidal Thoughts:  No  Memory:  Immediate;   Fair Recent;   Fair Remote;   Fair  Judgement:  Fair  Insight:  Fair  Psychomotor Activity:  Normal  Concentration:  Concentration: Fair  Recall:  [employer] of Knowledge:  Fair  Language:  Fair  Akathisia:  No  Handed:    AIMS (if indicated):     Assets:  Resilience Social Support  ADL's:  Intact  Cognition:  WNL  Sleep:  Number of Hours: 1.5   Treatment Plan Summary: Daily contact with patient to assess and evaluate symptoms and progress in treatment and Medication management   -Continue inpatient hospitalization.  -Will continue today 05/04/2018 plan as below except where it is noted.  -Schizophrenia   -Continue Risperdal 2mg  po BID.              - Continue Risperdal Consta 25 mg IM Q 14 days, (1st dose was administered on 05-03-18).   -Anxiety   -Continue vistaril 25mg  po TID prn anxiety  -agitation    -Continue benadryl 50mg  po/IM q6h prn agitation   -Continue haldol 5mg   po/IM q6h prn agitation   -Continue ativan 2mg  po/IM q6h prn agitation  -insomnia  -Continue trazodone 50mg  po qhs prn insomnia  -Encourage  participation in groups and therapeutic milieu  -disposition planning will be ongoing  Armandina Stammer, NP, PMHNP, FNP-BC. 05/04/2018, 2:24 PMPatient ID: [name], male   DOB: 1986-01-01, 32 y.o.   MRN: [id number]

## 2018-05-04 NOTE — Progress Notes (Signed)
Recreation Therapy Notes  Date: 9.5.19 Time: 1000 Location: 500 Hall Dayroom  Group Topic: Triggers  Goal Area(s) Addresses:  Patient will identify triggers. Patient will identify ways in which they deal with triggers. Patient will verbalize benefits of knowing what their triggers are.   Intervention: Worksheet  Activity: Triggers.  Patients were given a worksheet to identify their top three trigger, what they do to avoid or reduce contact with triggers and how they deal with triggers head on.  Education: Communication, Discharge Planning  Education Outcome: Acknowledges understanding/In group clarification offered/Needs additional education.   Clinical Observations/Feedback: Pt did not attend group.     Jerome Viglione, LRT/CTRS         Wandy Bossler A 05/04/2018 11:37 AM 

## 2018-05-04 NOTE — Accreditation Note (Signed)
Patient denies SI, HI and AVH this shift.  Patient has been compliant with medications.  Patient has attended groups and engaged in unit activities.   Assess patient for safety, offer medications as prescribed, engage patient in 1:1 staff talks.  Continue to monitor as planned. Patient able to contract for safety.  

## 2018-05-04 NOTE — Progress Notes (Signed)
Patient stated that he had a "great day" since he learned that he will be discharged tomorrow.

## 2018-05-05 NOTE — Plan of Care (Signed)
  Problem: Medication: Goal: Compliance with prescribed medication regimen will improve Outcome: Progressing   Problem: Safety: Goal: Ability to remain free from injury will improve Outcome: Progressing  Dar Note: Patient presents with anxious affect and mood.  Remained paranoid on the unit.  Encouraged to go to the Cafeteria for meals with positive result.  Denies suicidal thoughts, auditory and visual hallucinations.  Medications given as prescribed.  Offered support and encouragement as needed.  Routine safety checks maintained every 15 minutes.  Attended group and participated.  Patient safe on and off the unit.

## 2018-05-05 NOTE — Progress Notes (Signed)
Recreation Therapy Notes  Date: 9.6.19 Time: 1000 Location: 500 Hall Dayroom   Group Topic: Communication, Team Building, Problem Solving  Goal Area(s) Addresses:  Patient will effectively work with peer towards shared goal.  Patient will identify skills used to make activity successful.  Patient will identify how skills used during activity can be used to reach post d/c goals.  Intervention: STEM Activity  Activity: Stage manager. In teams patients were given 12 plastic drinking straws and a length of masking tape. Using the materials provided patients were asked to build a landing pad to catch a golf ball dropped from approximately 6 feet in the air.   Education: Pharmacist, community, Discharge Planning   Education Outcome: Acknowledges education/In group clarification offered/Needs additional education.   Clinical Observations/Feedback: Pt did not attend group.    Caroll Rancher, LRT/CTRS         Caroll Rancher A 05/05/2018 12:25 PM

## 2018-05-05 NOTE — [facility] Group Notes (Signed)
[facility] LCSW Group Therapy  05/05/2018 3:56 PM  Type of Therapy:  Group Therapy  Participation Level:  Did Not Attend  Participation Quality:  Did not attend  Affect:  Did not attend  Cognitive:  Did not attend  Insight:  Did not attend  Engagement in Therapy:  Did not attend  Modes of Intervention:  Did not attend  Summary of Progress/Problems:  Did not attend  Cherie Bohaboy 05/05/2018, 3:56 PM

## 2018-05-05 NOTE — Progress Notes (Signed)
DAR Note: Pt remained in his room for most of the evening; did attend wrap-up group. Pt at time of assessment denied all, "to be honest with you, I don't know why I here." Pt was med compliant. All patient's questions and concerns addressed. Support, encouragement, and safe environment provided. Pt continue to be paranoid.

## 2018-05-05 NOTE — Progress Notes (Signed)
[facility] MD Progress Note  05/05/2018 4:18 PM Scott Atkins  MRN:  [id number] Subjective:    Scott Atkins is a 32 y/o M with previous psychiatric history of bipolar I who was admitted on IVC initiated by his mother from [facility] ED where he presented with worsening disorganized behavior, agitation threatening to harm/kill his mother, paranoia that his food has been poisoned, unplanned weight loss, and impulsive behaviors such as jumping out of the vehicle. Pt was not taking medications prior to hospitalization, and he was started on trial of risperdal, and he was eventually transitioned to risperdal Consta. He has been appropriate while on the unit, but he continues to have paranoia regarding eating food, and he has been not eating some meals, for example, last evening he did not eat any of his dinner.  Today upon evaluation, pt shares, "I'm alright." He denies any specific concerns aside from seeking discharge. He slept poorly as per RN staff, but he asserts that he has been sleeping well. He reports that his appetite is good and he has been eating his meals entirely despite report of RN staff. Pt would only take a ginger ale last evening and insisted that it be opened in front of him.  He denies physical complaints. He denies SI/HI/AH/VH. He is tolerating his medications well. Discussed with patient about concerns that he is not consistently eating here, and pt was encouraged to go to the cafeteria and eat full meal to demonstrate that he would be safe for discharge. Pt verbalized good understanding. He had no further questions, comments, or concerns.   Principal Problem: Schizophrenia (HCC) Diagnosis:   Patient Active Problem List   Diagnosis Date Noted  . Schizophrenia (HCC) [F20.9] 04/29/2018  . Cigarette nicotine dependence without complication [F17.210] 09/23/2016   Total Time spent with patient: 30 minutes  Past Psychiatric History: see H&P  Past Medical History:  Past Medical History:   Diagnosis Date  . Mood disorder (HCC)    pt says he was never told dx by psychiatrist    Past Surgical History:  Procedure Laterality Date  . TYMPANOSTOMY TUBE PLACEMENT     Family History:  Family History  Problem Relation Age of Onset  . Heart attack Maternal Grandfather    Family Psychiatric  History: see H&P Social History:  Social History   Substance and Sexual Activity  Alcohol Use Yes   Comment: "drinks rarely"     Social History   Substance and Sexual Activity  Drug Use Yes  . Types: Marijuana   Comment: occassionally    Social History   Socioeconomic History  . Marital status: Single    Spouse name: Not on file  . Number of children: Not on file  . Years of education: Not on file  . Highest education level: Not on file  Occupational History  . Not on file  Social Needs  . Financial resource strain: Not on file  . Food insecurity:    Worry: Not on file    Inability: Not on file  . Transportation needs:    Medical: Not on file    Non-medical: Not on file  Tobacco Use  . Smoking status: Current Every Day Smoker    Packs/day: 0.25    Years: 12.00    Pack years: 3.00    Types: Cigarettes, Cigars  . Smokeless tobacco: Never Used  Substance and Sexual Activity  . Alcohol use: Yes    Comment: "drinks rarely"  . Drug use: Yes  Types: Marijuana    Comment: occassionally  . Sexual activity: Not on file  Lifestyle  . Physical activity:    Days per week: Not on file    Minutes per session: Not on file  . Stress: Not on file  Relationships  . Social connections:    Talks on phone: Not on file    Gets together: Not on file    Attends religious service: Not on file    Active member of club or organization: Not on file    Attends meetings of clubs or organizations: Not on file    Relationship status: Not on file  Other Topics Concern  . Not on file  Social History Narrative  . Not on file   Additional Social History:    Pain Medications: See  MAR Prescriptions: See MAR Over the Counter: See MAR History of alcohol / drug use?: Yes Name of Substance 1: Marijuana. 1 - Age of First Use: UTA 1 - Amount (size/oz): Pt denies use. Pt's UDS is postive for marijuana.  1 - Frequency: UTA 1 - Duration: UTA 1 - Last Use / Amount: UTA Name of Substance 2: Cigarettes.  2 - Age of First Use: UTA 2 - Amount (size/oz): Pt reported, smoking a pack of cigarettes, daily.  2 - Frequency: Daily.  2 - Duration: Ongoing.  2 - Last Use / Amount: Daily.                 Sleep: Poor  Appetite:  Poor  Current Medications: Current Facility-Administered Medications  Medication Dose Route Frequency Provider Last Rate Last Dose  . acetaminophen (TYLENOL) tablet 650 mg  650 mg Oral Q6H PRN Money, Gerlene Burdock, FNP      . alum & mag hydroxide-simeth (MAALOX/MYLANTA) 200-200-20 MG/5ML suspension 30 mL  30 mL Oral Q4H PRN Money, Feliz Beam B, FNP      . diphenhydrAMINE (BENADRYL) capsule 50 mg  50 mg Oral Q6H PRN Money, Gerlene Burdock, FNP       Or  . diphenhydrAMINE (BENADRYL) injection 50 mg  50 mg Intramuscular Q6H PRN Money, Feliz Beam B, FNP      . haloperidol (HALDOL) tablet 5 mg  5 mg Oral Q6H PRN Money, Feliz Beam B, FNP       Or  . haloperidol lactate (HALDOL) injection 5 mg  5 mg Intramuscular Q6H PRN Money, Gerlene Burdock, FNP      . hydrOXYzine (ATARAX/VISTARIL) tablet 25 mg  25 mg Oral TID PRN Money, Gerlene Burdock, FNP      . LORazepam (ATIVAN) tablet 2 mg  2 mg Oral Q6H PRN Money, Gerlene Burdock, FNP   2 mg at 04/30/18 2308   Or  . LORazepam (ATIVAN) injection 2 mg  2 mg Intramuscular Q6H PRN Money, Feliz Beam B, FNP      . magnesium hydroxide (MILK OF MAGNESIA) suspension 30 mL  30 mL Oral Daily PRN Money, Gerlene Burdock, FNP      . risperiDONE (RISPERDAL) tablet 2 mg  2 mg Oral BID Micheal Likens, MD   2 mg at 05/05/18 0752  . risperiDONE microspheres (RISPERDAL CONSTA) injection 25 mg  25 mg Intramuscular Q14 Days Micheal Likens, MD   25 mg at 05/03/18 1414   . traZODone (DESYREL) tablet 50 mg  50 mg Oral QHS PRN Money, Gerlene Burdock, FNP   50 mg at 05/04/18 2112    Lab Results: No results found for this or any previous visit (from the past 48 hour(s)).  Blood Alcohol level:  Lab Results  Component Value Date   ETH <10 04/29/2018    Metabolic Disorder Labs: No results found for: HGBA1C, MPG No results found for: PROLACTIN Lab Results  Component Value Date   CHOL 160 12/05/2017   TRIG 45 12/05/2017   HDL 38 (L) 12/05/2017   CHOLHDL 4.2 12/05/2017   VLDL 9 12/05/2017   LDLCALC 113 (H) 12/05/2017    Physical Findings: AIMS: Facial and Oral Movements Muscles of Facial Expression: None, normal Lips and Perioral Area: None, normal Jaw: None, normal Tongue: None, normal,Extremity Movements Upper (arms, wrists, hands, fingers): None, normal Lower (legs, knees, ankles, toes): None, normal, Trunk Movements Neck, shoulders, hips: None, normal, Overall Severity Severity of abnormal movements (highest score from questions above): None, normal Incapacitation due to abnormal movements: None, normal Patient's awareness of abnormal movements (rate only patient's report): No Awareness, Dental Status Current problems with teeth and/or dentures?: No Does patient usually wear dentures?: No  CIWA:  CIWA-Ar Total: 2 COWS:  COWS Total Score: 1  Musculoskeletal: Strength & Muscle Tone: within normal limits Gait & Station: normal Patient leans: N/A  Psychiatric Specialty Exam: Physical Exam  Nursing note and vitals reviewed.   Review of Systems  Constitutional: Negative for chills and fever.  Respiratory: Negative for cough and shortness of breath.   Cardiovascular: Negative for chest pain.  Gastrointestinal: Negative for abdominal pain, heartburn, nausea and vomiting.  Psychiatric/Behavioral: Negative for depression, hallucinations and suicidal ideas. The patient is nervous/anxious and has insomnia.     Blood pressure 93/69, pulse 73,  temperature 98 F (36.7 C), temperature source Oral, resp. rate 14, height 5\' 11"  (1.803 m), weight 106.1 kg, SpO2 100 %.Body mass index is 32.64 kg/m.  General Appearance: Casual and Fairly Groomed  Eye Contact:  Good  Speech:  Clear and Coherent and Normal Rate  Volume:  Normal  Mood:  Euthymic and Irritable  Affect:  Appropriate and Congruent  Thought Process:  Coherent and Goal Directed  Orientation:  Full (Time, Place, and Person)  Thought Content:  Ideas of Reference:   Paranoia Delusions and Paranoid Ideation  Suicidal Thoughts:  No  Homicidal Thoughts:  No  Memory:  Immediate;   Fair Recent;   Fair Remote;   Fair  Judgement:  Poor  Insight:  Lacking  Psychomotor Activity:  Normal  Concentration:  Concentration: Fair  Recall:  [employer] of Knowledge:  Fair  Language:  Fair  Akathisia:  No  Handed:    AIMS (if indicated):     Assets:  Resilience Social Support  ADL's:  Intact  Cognition:  WNL  Sleep:  Number of Hours: 2.5     Treatment Plan Summary: Daily contact with patient to assess and evaluate symptoms and progress in treatment and Medication management   -Continue inpatient hospitalization.  -Will continue today 05/04/2018 plan as below except where it is noted.  -Schizophrenia              -Continue Risperdal 2mg  po BID.              - Continue Risperdal Consta 25 mg IM Q 14 days, (1st dose was administered on 05-03-18).   -Anxiety             -Continue vistaril 25mg  po TID prn anxiety  -agitation                      -Continue benadryl 50mg  po/IM q6h prn agitation             -  Continue haldol 5mg  po/IM q6h prn agitation             -Continue ativan 2mg  po/IM q6h prn agitation  -insomnia             -Continue trazodone 50mg  po qhs prn insomnia  -Encourage participation in groups and therapeutic milieu  -disposition planning will be ongoing   Micheal Likens, MD 05/05/2018, 4:18 PM

## 2018-05-06 NOTE — Progress Notes (Signed)
Patient ID: Scott Atkins, male   DOB: 02-07-1986, 32 y.o.   MRN: [id number] DAR Note: Pt remained in his room for most of the evening. Pt at assessment denied any SI/HI, anxiety, depression, pain or AVH, "The doctor say I have to be able to eat before I can go." Pt was med compliant. All patient's questions and concerns addressed. Support, encouragement, and safe environment provided. Pt continue to be paranoid.

## 2018-05-06 NOTE — Progress Notes (Addendum)
[facility] MD Progress Note  05/06/2018 9:46 AM JAXSTON MANCUSI  MRN:  [id number]   Evaluation: Peyton Najjar observed resting in bed.  He is awake alert and oriented x3.  Patient reports he is hopeful to discharge on Monday as discussed between him and the psychiatrist.  Reports taking the medication injection on yesterday, denies site reactions or medication side effects.  Reports he has spoken to his mother regarding the altercation/disagreement they had and states that they have worked things out.  States his mother came to visit on yesterday states the visit went well.  Reports he went to breakfast this morning and will continue to go to the cafeteria to eat as per their agreement between patient and the psychiatrist.  Fair appetite.  Reports resting "okay" denies auditory or visual hallucinations.  Denies suicidal or homicidal ideations support encouragement reassurance was provided.   History: per assessment note-Steven Bonny is a 32 y/o M with previous psychiatric history of bipolar I who was admitted on IVC initiated by his mother from [facility] ED where he presented with worsening disorganized behavior, agitation threatening to harm/kill his mother, paranoia that his food has been poisoned, unplanned weight loss, and impulsive behaviors such as jumping out of the vehicle. Pt was not taking medications prior to hospitalization, and he was started on trial of risperdal, and he was eventually transitioned to risperdal Consta. He has been appropriate while on the unit, but he continues to have paranoia regarding eating food, and he has been not eating some meals, for example, last evening he did not eat any of his dinner.    Principal Problem: Schizophrenia (HCC) Diagnosis:   Patient Active Problem List   Diagnosis Date Noted  . Schizophrenia (HCC) [F20.9] 04/29/2018  . Cigarette nicotine dependence without complication [F17.210] 09/23/2016   Total Time spent with patient: 30 minutes  Past Psychiatric  History: see H&P  Past Medical History:  Past Medical History:  Diagnosis Date  . Mood disorder (HCC)    pt says he was never told dx by psychiatrist    Past Surgical History:  Procedure Laterality Date  . TYMPANOSTOMY TUBE PLACEMENT     Family History:  Family History  Problem Relation Age of Onset  . Heart attack Maternal Grandfather    Family Psychiatric  History: see H&P Social History:  Social History   Substance and Sexual Activity  Alcohol Use Yes   Comment: "drinks rarely"     Social History   Substance and Sexual Activity  Drug Use Yes  . Types: Marijuana   Comment: occassionally    Social History   Socioeconomic History  . Marital status: Single    Spouse name: Not on file  . Number of children: Not on file  . Years of education: Not on file  . Highest education level: Not on file  Occupational History  . Not on file  Social Needs  . Financial resource strain: Not on file  . Food insecurity:    Worry: Not on file    Inability: Not on file  . Transportation needs:    Medical: Not on file    Non-medical: Not on file  Tobacco Use  . Smoking status: Current Every Day Smoker    Packs/day: 0.25    Years: 12.00    Pack years: 3.00    Types: Cigarettes, Cigars  . Smokeless tobacco: Never Used  Substance and Sexual Activity  . Alcohol use: Yes    Comment: "drinks rarely"  . Drug  use: Yes    Types: Marijuana    Comment: occassionally  . Sexual activity: Not on file  Lifestyle  . Physical activity:    Days per week: Not on file    Minutes per session: Not on file  . Stress: Not on file  Relationships  . Social connections:    Talks on phone: Not on file    Gets together: Not on file    Attends religious service: Not on file    Active member of club or organization: Not on file    Attends meetings of clubs or organizations: Not on file    Relationship status: Not on file  Other Topics Concern  . Not on file  Social History Narrative  . Not  on file   Additional Social History:    Pain Medications: See MAR Prescriptions: See MAR Over the Counter: See MAR History of alcohol / drug use?: Yes Name of Substance 1: Marijuana. 1 - Age of First Use: UTA 1 - Amount (size/oz): Pt denies use. Pt's UDS is postive for marijuana.  1 - Frequency: UTA 1 - Duration: UTA 1 - Last Use / Amount: UTA Name of Substance 2: Cigarettes.  2 - Age of First Use: UTA 2 - Amount (size/oz): Pt reported, smoking a pack of cigarettes, daily.  2 - Frequency: Daily.  2 - Duration: Ongoing.  2 - Last Use / Amount: Daily.                 Sleep: Poor  Appetite:  Poor  Current Medications: Current Facility-Administered Medications  Medication Dose Route Frequency Provider Last Rate Last Dose  . acetaminophen (TYLENOL) tablet 650 mg  650 mg Oral Q6H PRN Money, Gerlene Burdock, FNP      . alum & mag hydroxide-simeth (MAALOX/MYLANTA) 200-200-20 MG/5ML suspension 30 mL  30 mL Oral Q4H PRN Money, Feliz Beam B, FNP      . diphenhydrAMINE (BENADRYL) capsule 50 mg  50 mg Oral Q6H PRN Money, Gerlene Burdock, FNP       Or  . diphenhydrAMINE (BENADRYL) injection 50 mg  50 mg Intramuscular Q6H PRN Money, Feliz Beam B, FNP      . haloperidol (HALDOL) tablet 5 mg  5 mg Oral Q6H PRN Money, Feliz Beam B, FNP       Or  . haloperidol lactate (HALDOL) injection 5 mg  5 mg Intramuscular Q6H PRN Money, Gerlene Burdock, FNP      . hydrOXYzine (ATARAX/VISTARIL) tablet 25 mg  25 mg Oral TID PRN Money, Gerlene Burdock, FNP   25 mg at 05/05/18 2131  . LORazepam (ATIVAN) tablet 2 mg  2 mg Oral Q6H PRN Money, Gerlene Burdock, FNP   2 mg at 04/30/18 2308   Or  . LORazepam (ATIVAN) injection 2 mg  2 mg Intramuscular Q6H PRN Money, Feliz Beam B, FNP      . magnesium hydroxide (MILK OF MAGNESIA) suspension 30 mL  30 mL Oral Daily PRN Money, Gerlene Burdock, FNP      . risperiDONE (RISPERDAL) tablet 2 mg  2 mg Oral BID Micheal Likens, MD   2 mg at 05/06/18 0813  . risperiDONE microspheres (RISPERDAL CONSTA) injection 25  mg  25 mg Intramuscular Q14 Days Micheal Likens, MD   25 mg at 05/03/18 1414  . traZODone (DESYREL) tablet 50 mg  50 mg Oral QHS PRN Money, Gerlene Burdock, FNP   50 mg at 05/05/18 2131    Lab Results: No results found for this or  any previous visit (from the past 48 hour(s)).  Blood Alcohol level:  Lab Results  Component Value Date   ETH <10 04/29/2018    Metabolic Disorder Labs: No results found for: HGBA1C, MPG No results found for: PROLACTIN Lab Results  Component Value Date   CHOL 160 12/05/2017   TRIG 45 12/05/2017   HDL 38 (L) 12/05/2017   CHOLHDL 4.2 12/05/2017   VLDL 9 12/05/2017   LDLCALC 113 (H) 12/05/2017    Physical Findings: AIMS: Facial and Oral Movements Muscles of Facial Expression: None, normal Lips and Perioral Area: None, normal Jaw: None, normal Tongue: None, normal,Extremity Movements Upper (arms, wrists, hands, fingers): None, normal Lower (legs, knees, ankles, toes): None, normal, Trunk Movements Neck, shoulders, hips: None, normal, Overall Severity Severity of abnormal movements (highest score from questions above): None, normal Incapacitation due to abnormal movements: None, normal Patient's awareness of abnormal movements (rate only patient's report): No Awareness, Dental Status Current problems with teeth and/or dentures?: No Does patient usually wear dentures?: No  CIWA:  CIWA-Ar Total: 2 COWS:  COWS Total Score: 1  Musculoskeletal: Strength & Muscle Tone: within normal limits Gait & Station: normal Patient leans: N/A  Psychiatric Specialty Exam: Physical Exam  Nursing note and vitals reviewed.   Review of Systems  Psychiatric/Behavioral: Negative for depression, hallucinations and suicidal ideas. The patient is nervous/anxious and has insomnia.   All other systems reviewed and are negative.   Blood pressure (!) 116/55, pulse (!) 111, temperature (!) 97.5 F (36.4 C), temperature source Oral, resp. rate 14, height 5\' 11"  (1.803  m), weight 106.1 kg, SpO2 100 %.Body mass index is 32.64 kg/m.  General Appearance: Casual and Fairly Groomed  Eye Contact:  Good  Speech:  Clear and Coherent and Normal Rate  Volume:  Normal  Mood:  Anxious and Depressed  Affect:  Appropriate and Congruent  Thought Process:  Coherent and Goal Directed  Orientation:  Full (Time, Place, and Person)  Thought Content:  Ideas of Reference:   Paranoia Delusions and Paranoid Ideation  Suicidal Thoughts:  No  Homicidal Thoughts:  No  Memory:  Immediate;   Fair Recent;   Fair Remote;   Fair  Judgement:  Poor  Insight:  Lacking  Psychomotor Activity:  Normal  Concentration:  Concentration: Fair  Recall:  [employer] of Knowledge:  Fair  Language:  Fair  Akathisia:  No  Handed:    AIMS (if indicated):     Assets:  Resilience Social Support  ADL's:  Intact  Cognition:  WNL  Sleep:  Number of Hours: 2.5     Treatment Plan Summary: Daily contact with patient to assess and evaluate symptoms and progress in treatment and Medication management   -Will continue today 05/06/2018 plan as below except where it is noted.  -Schizophrenia              -Continue Risperdal 2mg  po BID.              - Continue Risperdal Consta 25 mg IM Q 14 days, (1st dose was administered on 05-03-18).   -Anxiety             -Continue vistaril 25mg  po TID prn anxiety  -agitation                      -Continue benadryl 50mg  po/IM q6h prn agitation             -Continue haldol 5mg  po/IM q6h prn agitation             -  Continue ativan 2mg  po/IM q6h prn agitation  -insomnia             -Continue trazodone 50mg  po qhs prn insomnia  -Encourage participation in groups and therapeutic milieu -disposition planning will be ongoing   Oneta Rack, NP 05/06/2018, 9:46 AM    ..Agree with NP Progress Note

## 2018-05-06 NOTE — [facility] Group Notes (Signed)
[facility] Group Notes:  (Nursing/MHT/Case Management/Adjunct)  Date:  05/06/2018  Time:  3:01 PM  Type of Therapy:  Nurse Education  Participation Level:  Active  Participation Quality:  Appropriate and Attentive  Affect:  Appropriate  Cognitive:  Appropriate  Insight:  Improving  Engagement in Group:  Engaged  Modes of Intervention:  Discussion, Education and Support  Summary of Progress/Problems: Pt was attentive during discussion of sleep hygiene.   Maurine Simmering 05/06/2018, 3:01 PM

## 2018-05-06 NOTE — Progress Notes (Signed)
He verbalized that he attended all three meals in the cafeteria and that he socialized with his peers. His goal for tomorrow is to get discharged.

## 2018-05-06 NOTE — Progress Notes (Signed)
D: Scott Atkins has been calm/cooperative today, denying SI/HI/AVH as well as concerns/questions/complaints. He has been out in the milieu and has attended groups, though he remains quiet. He reported good sleep, good appetite, normal energy level, and good concentration. On his self inventory form, he rated his levels of depression, hopelessness, and anxiety all zero/10 (10 being worst).   A: Meds given as ordered. No PRNs requested or given. Q15 safety checks maintained. Support/encouragement offered.  R: Pt remains free from harm and continues with treatment. Will continue to monitor for needs/safety.

## 2018-05-06 NOTE — [facility] Group Notes (Signed)
  [facility] LCSW Group Therapy Note  Date/Time:  05/06/2018 11:15AM-12:00PM  Type of Therapy and Topic:  Group Therapy:  Feelings About Hospitalization  Participation Level:  Minimal   Description of Group This process group involved patients discussing their feelings related to being hospitalized, as well as the benefits they see to being in the hospital.  These feelings and benefits were itemized.  The group then brainstormed specific ways in which they could seek those same benefits when they discharge and return home.  Therapeutic Goals 1. Patient will identify and describe positive and negative feelings related to hospitalization 2. Patient will verbalize benefits of hospitalization to themselves personally 3. Patients will brainstorm together ways they can obtain similar benefits in the outpatient setting, identify barriers to wellness and possible solutions  Summary of Patient Progress:  The patient expressed his primary feelings about being hospitalized are "blank" and he is just biding his time.  He talked little during group.  Therapeutic Modalities Cognitive Behavioral Therapy Motivational Interviewing    Ambrose Mantle, LCSW 05/06/2018, 8:34 AM  ;l

## 2018-05-07 NOTE — [facility] Group Notes (Signed)
Adult Psychoeducational Group Note  Date:  05/07/2018 Time:  9:43 PM  Group Topic/Focus:  Wrap-Up Group:   The focus of this group is to help patients review their daily goal of treatment and discuss progress on daily workbooks.  Participation Level:  Active  Participation Quality:  Appropriate and Attentive  Affect:  Appropriate  Cognitive:  Alert and Appropriate  Insight: Appropriate and Good  Engagement in Group:  Engaged  Modes of Intervention:  Discussion and Education  Additional Comments:  Pt attended and participated in wrap up group this evening. Pt rated their day a 10/10, due to them watching football all day. Pt completed their goal, which was to work on a discharge plan. Pt will be leaving tomorrow.    Chrisandra Netters 05/07/2018, 9:43 PM

## 2018-05-07 NOTE — Progress Notes (Signed)
Patient ID: Scott Atkins, male   DOB: 03-07-1986, 32 y.o.   MRN: [id number]    D: Pt remains very paranoid on the unit today, he questioned his medication reported that he had not been taking medication. This Clinical research associate explained to patient that he had been taking medication, but before he would take this writer had to show him where the medication was listed in the computer by the doctor. Pt reported being ready for discharge. Pt reported that his depression was a 0, his hopelessness was a 0, and his anxiety was a 0. Pt reported that his goal for today was to watch football game in peace. Pt reported being negative SI/HI, no AH/VH noted. A: 15 min checks continued for patient safety. R: Pt safety maintained.

## 2018-05-07 NOTE — Progress Notes (Signed)
Patient ID: Scott Atkins, male   DOB: 09/16/1985, 32 y.o.   MRN: [id number] DAR Note: Pt remained paranoid, confuse and delusional; "I know all of you working here; we all met at the dinner party." Pt observed withdrawn to self even while in the dayroom. Pt denied any anxiety, depression, pain or AVH. Pt was med compliant; asked several questions about his medications before taking them. All patient's questions and concerns addressed. Support, encouragement, and safe environment provided. 15-minute safety checks continue. Pt did attend wrap-up group.

## 2018-05-07 NOTE — Progress Notes (Addendum)
[facility] MD Progress Note  05/07/2018 11:18 AM Scott Atkins  MRN:  [id number]   Evaluation: Scott Atkins episode pacing the unit.  He is awake alert and oriented x3.  Scott Atkins is requesting to be discharged early on tomorrow.  Reports feeling better overall.  Patient presents guarded but pleasant.  Chart reviewed staff documented paranoia, confusion and delusional.  Patient reports" hearing small noises in his room on last night."  However states nothing to be concerned about.  Denies depression or depressive symptoms.  Denies suicidal or homicidal ideations.  Denies auditory or visual hallucinations.  Reports taking medication and tolerating well.  Support encouragement reassurance was provided.  History: per assessment note-Scott Atkins is a 32 y/o M with previous psychiatric history of bipolar I who was admitted on IVC initiated by his mother from [facility] ED where he presented with worsening disorganized behavior, agitation threatening to harm/kill his mother, paranoia that his food has been poisoned, unplanned weight loss, and impulsive behaviors such as jumping out of the vehicle. Pt was not taking medications prior to hospitalization, and he was started on trial of risperdal, and he was eventually transitioned to risperdal Consta. He has been appropriate while on the unit, but he continues to have paranoia regarding eating food, and he has been not eating some meals, for example, last evening he did not eat any of his dinner.    Principal Problem: Schizophrenia (HCC) Diagnosis:   Patient Active Problem List   Diagnosis Date Noted  . Schizophrenia (HCC) [F20.9] 04/29/2018  . Cigarette nicotine dependence without complication [F17.210] 09/23/2016   Total Time spent with patient: 30 minutes  Past Psychiatric History: see H&P  Past Medical History:  Past Medical History:  Diagnosis Date  . Mood disorder (HCC)    pt says he was never told dx by psychiatrist    Past Surgical History:  Procedure  Laterality Date  . TYMPANOSTOMY TUBE PLACEMENT     Family History:  Family History  Problem Relation Age of Onset  . Heart attack Maternal Grandfather    Family Psychiatric  History: see H&P Social History:  Social History   Substance and Sexual Activity  Alcohol Use Yes   Comment: "drinks rarely"     Social History   Substance and Sexual Activity  Drug Use Yes  . Types: Marijuana   Comment: occassionally    Social History   Socioeconomic History  . Marital status: Single    Spouse name: Not on file  . Number of children: Not on file  . Years of education: Not on file  . Highest education level: Not on file  Occupational History  . Not on file  Social Needs  . Financial resource strain: Not on file  . Food insecurity:    Worry: Not on file    Inability: Not on file  . Transportation needs:    Medical: Not on file    Non-medical: Not on file  Tobacco Use  . Smoking status: Current Every Day Smoker    Packs/day: 0.25    Years: 12.00    Pack years: 3.00    Types: Cigarettes, Cigars  . Smokeless tobacco: Never Used  Substance and Sexual Activity  . Alcohol use: Yes    Comment: "drinks rarely"  . Drug use: Yes    Types: Marijuana    Comment: occassionally  . Sexual activity: Not on file  Lifestyle  . Physical activity:    Days per week: Not on file  Minutes per session: Not on file  . Stress: Not on file  Relationships  . Social connections:    Talks on phone: Not on file    Gets together: Not on file    Attends religious service: Not on file    Active member of club or organization: Not on file    Attends meetings of clubs or organizations: Not on file    Relationship status: Not on file  Other Topics Concern  . Not on file  Social History Narrative  . Not on file   Additional Social History:    Pain Medications: See MAR Prescriptions: See MAR Over the Counter: See MAR History of alcohol / drug use?: Yes Name of Substance 1: Marijuana. 1 -  Age of First Use: UTA 1 - Amount (size/oz): Pt denies use. Pt's UDS is postive for marijuana.  1 - Frequency: UTA 1 - Duration: UTA 1 - Last Use / Amount: UTA Name of Substance 2: Cigarettes.  2 - Age of First Use: UTA 2 - Amount (size/oz): Pt reported, smoking a pack of cigarettes, daily.  2 - Frequency: Daily.  2 - Duration: Ongoing.  2 - Last Use / Amount: Daily.                 Sleep: Poor  Appetite:  Poor  Current Medications: Current Facility-Administered Medications  Medication Dose Route Frequency Provider Last Rate Last Dose  . acetaminophen (TYLENOL) tablet 650 mg  650 mg Oral Q6H PRN Money, Gerlene Burdock, FNP      . alum & mag hydroxide-simeth (MAALOX/MYLANTA) 200-200-20 MG/5ML suspension 30 mL  30 mL Oral Q4H PRN Money, Feliz Beam B, FNP      . diphenhydrAMINE (BENADRYL) capsule 50 mg  50 mg Oral Q6H PRN Money, Gerlene Burdock, FNP       Or  . diphenhydrAMINE (BENADRYL) injection 50 mg  50 mg Intramuscular Q6H PRN Money, Feliz Beam B, FNP      . haloperidol (HALDOL) tablet 5 mg  5 mg Oral Q6H PRN Money, Feliz Beam B, FNP       Or  . haloperidol lactate (HALDOL) injection 5 mg  5 mg Intramuscular Q6H PRN Money, Gerlene Burdock, FNP      . hydrOXYzine (ATARAX/VISTARIL) tablet 25 mg  25 mg Oral TID PRN Money, Gerlene Burdock, FNP   25 mg at 05/05/18 2131  . LORazepam (ATIVAN) tablet 2 mg  2 mg Oral Q6H PRN Money, Gerlene Burdock, FNP   2 mg at 04/30/18 2308   Or  . LORazepam (ATIVAN) injection 2 mg  2 mg Intramuscular Q6H PRN Money, Feliz Beam B, FNP      . magnesium hydroxide (MILK OF MAGNESIA) suspension 30 mL  30 mL Oral Daily PRN Money, Gerlene Burdock, FNP      . risperiDONE (RISPERDAL) tablet 2 mg  2 mg Oral BID Micheal Likens, MD   2 mg at 05/07/18 0818  . risperiDONE microspheres (RISPERDAL CONSTA) injection 25 mg  25 mg Intramuscular Q14 Days Micheal Likens, MD   25 mg at 05/03/18 1414  . traZODone (DESYREL) tablet 50 mg  50 mg Oral QHS PRN Money, Gerlene Burdock, FNP   50 mg at 05/06/18 2309     Lab Results: No results found for this or any previous visit (from the past 48 hour(s)).  Blood Alcohol level:  Lab Results  Component Value Date   ETH <10 04/29/2018    Metabolic Disorder Labs: No results found for: HGBA1C, MPG No  results found for: PROLACTIN Lab Results  Component Value Date   CHOL 160 12/05/2017   TRIG 45 12/05/2017   HDL 38 (L) 12/05/2017   CHOLHDL 4.2 12/05/2017   VLDL 9 12/05/2017   LDLCALC 113 (H) 12/05/2017    Physical Findings: AIMS: Facial and Oral Movements Muscles of Facial Expression: None, normal Lips and Perioral Area: None, normal Jaw: None, normal Tongue: None, normal,Extremity Movements Upper (arms, wrists, hands, fingers): None, normal Lower (legs, knees, ankles, toes): None, normal, Trunk Movements Neck, shoulders, hips: None, normal, Overall Severity Severity of abnormal movements (highest score from questions above): None, normal Incapacitation due to abnormal movements: None, normal Patient's awareness of abnormal movements (rate only patient's report): No Awareness, Dental Status Current problems with teeth and/or dentures?: No Does patient usually wear dentures?: No  CIWA:  CIWA-Ar Total: 2 COWS:  COWS Total Score: 1  Musculoskeletal: Strength & Muscle Tone: within normal limits Gait & Station: normal Patient leans: N/A  Psychiatric Specialty Exam: Physical Exam  Nursing note and vitals reviewed. Constitutional: He appears well-developed.  Neurological: He is alert.  Psychiatric: He has a normal mood and affect. His behavior is normal.    Review of Systems  Psychiatric/Behavioral: Negative for depression, hallucinations and suicidal ideas. The patient is nervous/anxious and has insomnia.   All other systems reviewed and are negative.   Blood pressure 106/66, pulse (!) 106, temperature 97.7 F (36.5 C), temperature source Oral, resp. rate 14, height 5\' 11"  (1.803 m), weight 106.1 kg, SpO2 100 %.Body mass index is  32.64 kg/m.  General Appearance: Casual and Fairly Groomed  Eye Contact:  Good  Speech:  Clear and Coherent and Normal Rate  Volume:  Normal  Mood:  Anxious and Depressed  Affect:  Appropriate and Congruent  Thought Process:  Coherent and Goal Directed  Orientation:  Full (Time, Place, and Person)  Thought Content:  Ideas of Reference:   Paranoia Delusions and Paranoid Ideation  Suicidal Thoughts:  No  Homicidal Thoughts:  No  Memory:  Immediate;   Fair Recent;   Fair Remote;   Fair  Judgement:  Poor  Insight:  Lacking  Psychomotor Activity:  Normal  Concentration:  Concentration: Fair  Recall:  [employer] of Knowledge:  Fair  Language:  Fair  Akathisia:  No  Handed:    AIMS (if indicated):     Assets:  Desire for Improvement Resilience Social Support  ADL's:  Intact  Cognition:  WNL  Sleep:  Number of Hours: 3.5     Treatment Plan Summary: Daily contact with patient to assess and evaluate symptoms and progress in treatment and Medication management   Continue with current treatment plan on 05/07/2018 as listed below except with  -Schizophrenia              -Continue Risperdal 2mg  po BID.              - Continue Risperdal Consta 25 mg IM Q 14 days, (1st dose was administered on 05-03-18).   -Anxiety             -Continue vistaril 25mg  po TID prn anxiety  -agitation                      -Continue benadryl 50mg  po/IM q6h prn agitation             -Continue haldol 5mg  po/IM q6h prn agitation             -Continue  ativan 2mg  po/IM q6h prn agitation  -insomnia             -Continue trazodone 50mg  po qhs prn insomnia  -Encourage participation in groups and therapeutic milieu -disposition planning will be ongoing   Oneta Rack, NP 05/07/2018, 11:18 AM    ..Agree with NP Progress Note

## 2018-05-07 NOTE — [facility] Group Notes (Signed)
[facility] Group Notes: (Clinical Social Work)   05/07/2018      Type of Therapy:  Group Therapy   Participation Level:  Did Not Attend despite MHT prompting   Ambrose Mantle, LCSW 05/07/2018, 1:06 PM

## 2018-05-08 MED ORDER — TRAZODONE HCL 50 MG PO TABS: 50.0000 mg | ORAL_TABLET | Freq: Every evening | ORAL | 0 refills | Status: DC | PRN

## 2018-05-08 MED ORDER — RISPERIDONE 2 MG PO TABS: 2.0000 mg | ORAL_TABLET | Freq: Two times a day (BID) | ORAL | 0 refills | Status: DC

## 2018-05-08 MED ORDER — HYDROXYZINE HCL 25 MG PO TABS: 25.0000 mg | ORAL_TABLET | Freq: Three times a day (TID) | ORAL | 0 refills | Status: DC | PRN

## 2018-05-08 MED ORDER — RISPERIDONE MICROSPHERES 25 MG IM SUSR: 25.0000 mg | INTRAMUSCULAR | 0 refills | Status: DC

## 2018-05-08 NOTE — BHH Suicide Risk Assessment (Signed)
[facility] Discharge Suicide Risk Assessment   Principal Problem: Schizophrenia [facility]) Discharge Diagnoses:  Patient Active Problem List   Diagnosis Date Noted  . Schizophrenia (HCC) [F20.9] 04/29/2018  . Cigarette nicotine dependence without complication [F17.210] 09/23/2016    Total Time spent with patient: 30 minutes  Musculoskeletal: Strength & Muscle Tone: within normal limits Gait & Station: normal Patient leans: N/A  Psychiatric Specialty Exam: Review of Systems  Constitutional: Negative for chills and fever.  Respiratory: Negative for cough and shortness of breath.   Cardiovascular: Negative for chest pain.  Gastrointestinal: Negative for abdominal pain, heartburn, nausea and vomiting.  Psychiatric/Behavioral: Negative for depression, hallucinations and suicidal ideas. The patient is not nervous/anxious and does not have insomnia.     Blood pressure 104/88, pulse 88, temperature 98.4 F (36.9 C), resp. rate 18, height 5\' 11"  (1.803 m), weight 106.1 kg, SpO2 100 %.Body mass index is 32.64 kg/m.  General Appearance: Casual and Fairly Groomed  Patent attorney::  Good  Speech:  Clear and Coherent and Normal Rate  Volume:  Normal  Mood:  Euthymic  Affect:  Appropriate, Congruent and Constricted  Thought Process:  Coherent and Goal Directed  Orientation:  Full (Time, Place, and Person)  Thought Content:  Logical  Suicidal Thoughts:  No  Homicidal Thoughts:  No  Memory:  Immediate;   Fair Recent;   Fair Remote;   Fair  Judgement:  Fair  Insight:  Fair  Psychomotor Activity:  Normal  Concentration:  Good  Recall:  Good  Fund of Knowledge:Good  Language: Poor  Akathisia:  No  Handed:    AIMS (if indicated):     Assets:  Resilience Social Support  Sleep:  Number of Hours: 0  Cognition: WNL  ADL's:  Intact   Mental Status Per Nursing Assessment::   On Admission:  NA(denies SI)  Demographic Factors:  Male, Low socioeconomic status and Unemployed  Loss  Factors: Financial problems/change in socioeconomic status  Historical Factors: Impulsivity  Risk Reduction Factors:   Positive social support, Positive therapeutic relationship and Positive coping skills or problem solving skills  Continued Clinical Symptoms:  Severe Anxiety and/or Agitation Schizophrenia:   Less than 46 years old Paranoid or undifferentiated type  Cognitive Features That Contribute To Risk:  None    Suicide Risk:  Minimal: No identifiable suicidal ideation.  Patients presenting with no risk factors but with morbid ruminations; may be classified as minimal risk based on the severity of the depressive symptoms  Follow-up Information    Services, [facility]. Go on 05/09/2018.   Why:  Please attend your appt on Tuesday, 05/09/18, at 10:00am.  Please bring photo ID, social security card, and insurance card. Contact information: [address] [place] [address] [phone]         Subjective Data:  Scott Atkins is a 32 y/o M with previous psychiatric history of bipolar I who was admitted on IVC initiated by his mother from [facility] ED where he presented with worsening disorganized behavior, agitation threatening to harm/kill his mother, paranoia that his food has been poisoned, unplanned weight loss, and impulsive behaviors such as jumping out of the vehicle. Pt was not taking medications prior to hospitalization, and he was started on trial of risperdal, and he was eventually transitioned to risperdal Consta. He had some ongoing paranoia regarding eating food initially, but with encouragement he began to attend meals and he has been appropriate on the unit.   Today upon evaluation, pt shares, "I'm good." He denies  any specific concerns. He slept poorly overnight, and pt explains that he was anxious about potential discharge.He reports that his appetite is good and he has been eating all his meals at the cafeteria. He denies physical complaints. He denies  SI/HI/AH/VH. He is tolerating his medications well. Discussed with patient about importance of continuing to take oral form of risperdal in addition to Consta until directed by his outpatient provider, and pt verbalized good understanding. He was able to engage in safety planning including plan to return to [facility] or contact emergency services if he feels unable to maintain his own safety or the safety of others. Pt had no further questions, comments, or concerns.   Plan Of Care/Follow-up recommendations:   -Discharge to outpatient level of care  -Schizophrenia -Continue Risperdal 2mg  po BID. - Continue Risperdal Consta 25 mg IM Q 14 days, (1st dose was administered on 05-03-18).   -Anxiety -Continue vistaril 25mg  po TID prn anxiety  -insomnia -Continue trazodone 50mg  po qhs prn insomnia  Activity:  as tolerated Diet:  normal Tests:  NA Other:  see above for DC plan  Micheal Likens, MD 05/08/2018, 10:35 AM

## 2018-05-08 NOTE — Progress Notes (Signed)
  [facility] Adult Case Management Discharge Plan :  Will you be returning to the same living situation after discharge:  Yes,  with mother At discharge, do you have transportation home?: Yes,  neice Do you have the ability to pay for your medications: No.  Release of information consent forms completed and in the chart;  Patient's signature needed at discharge.  Patient to Follow up at: Follow-up Information    [facility], [facility]. Go on 05/09/2018.   Why:  Please attend your appt on Tuesday, 05/09/18, at 10:00am.  Please bring photo ID, social security card, and insurance card. Contact information: [address] [place] [address] [phone]           Next level of care provider has access to [facility] Link:no  Safety Planning and Suicide Prevention discussed: Yes,  with mother  Have you used any form of tobacco in the last 30 days? (Cigarettes, Smokeless Tobacco, Cigars, and/or Pipes): Yes  Has patient been referred to the Quitline?: Patient refused referral  Patient has been referred for addiction treatment: Yes  Lorri Frederick, LCSW 05/08/2018, 1:12 PM

## 2018-05-08 NOTE — Discharge Summary (Addendum)
Physician Discharge Summary Note  Patient:  Scott Atkins is an 32 y.o., male MRN:  [id number] DOB:  07/15/1986 Patient phone:  [phone] (home)  Patient address:   [address] [place] [address],  Total Time spent with patient: 20 minutes  Date of Admission:  04/30/2018 Date of Discharge: 05/08/18  Reason for Admission:  Worsening psychotic behavior with SI/HI statements  Principal Problem: Schizophrenia [facility]) Discharge Diagnoses: Patient Active Problem List   Diagnosis Date Noted  . Schizophrenia (HCC) [F20.9] 04/29/2018  . Cigarette nicotine dependence without complication [F17.210] 09/23/2016    Past Psychiatric History: -previous dx of bipolar - outpt follow up with Dr. Jannifer Franklin - no hx of inpatient treatment - no hx of suicide attempt  Past Medical History:  Past Medical History:  Diagnosis Date  . Mood disorder (HCC)    pt says he was never told dx by psychiatrist    Past Surgical History:  Procedure Laterality Date  . TYMPANOSTOMY TUBE PLACEMENT     Family History:  Family History  Problem Relation Age of Onset  . Heart attack Maternal Grandfather    Family Psychiatric  History: Denies Social History:  Social History   Substance and Sexual Activity  Alcohol Use Yes   Comment: "drinks rarely"     Social History   Substance and Sexual Activity  Drug Use Yes  . Types: Marijuana   Comment: occassionally    Social History   Socioeconomic History  . Marital status: Single    Spouse name: Not on file  . Number of children: Not on file  . Years of education: Not on file  . Highest education level: Not on file  Occupational History  . Not on file  Social Needs  . Financial resource strain: Not on file  . Food insecurity:    Worry: Not on file    Inability: Not on file  . Transportation needs:    Medical: Not on file    Non-medical: Not on file  Tobacco Use  . Smoking status: Current Every Day Smoker    Packs/day: 0.25    Years: 12.00   Pack years: 3.00    Types: Cigarettes, Cigars  . Smokeless tobacco: Never Used  Substance and Sexual Activity  . Alcohol use: Yes    Comment: "drinks rarely"  . Drug use: Yes    Types: Marijuana    Comment: occassionally  . Sexual activity: Not on file  Lifestyle  . Physical activity:    Days per week: Not on file    Minutes per session: Not on file  . Stress: Not on file  Relationships  . Social connections:    Talks on phone: Not on file    Gets together: Not on file    Attends religious service: Not on file    Active member of club or organization: Not on file    Attends meetings of clubs or organizations: Not on file    Relationship status: Not on file  Other Topics Concern  . Not on file  Social History Narrative  . Not on file    Hospital Course:   05/01/18 [facility] MD Assessment: 59 y/o M with previous psychiatric history of bipolar I who was admitted on IVC initiated by his mother from [facility] ED where he presented with worsening disorganized behavior, agitation threatening to harm/kill his mother, paranoia that his food has been poisoned, unplanned weight loss, and impulsive behaviors such as jumping out of the vehicle. Pt was  not taking medications prior to hospitalization, and he was started on trial of risperdal. He was transferred to [facility] for additional treatment and stabilization. Upon initial interview, pt shares, "It was getting to the point.Marland KitchenMarland Kitchen I was angered more than anything. My activities flared up. She tricked me into going to the hospital." Pt relates story that his mother feigned an illness to convince pt to go the hospital, but then when he arrived he was informed that he was being placed on IVC. When asked why he thinks his mother did that, pt explains, "Probably the cussing - the activities." Pt explains that there have been increasing frequency and intensity of verbal altercations in the home. He denies any instance of violence. He denies destroying any property  around the home, and when asked directly about punching holes in the wall, pt explains it was from moving furniture. He endorses some loss of weight which has been unplanned, but he denies any paranoia or concerns that the food is poisoned or contaminated. He explains that his family may have had a misunderstanding that he was worried about his niece putting toothpaste on his cupcake as had happened once in the past. He denies symptoms of depression, mania, OCD, and PTSD. He denies SI/HI/AH/VH. He drinks about 1 pint of hard alcohol per week and smokes 1 ppd. He denies other illicit substance use. Discussed with patient about treatment options. He is currently taking no medications, but he recalls previous follow up with Dr. Jannifer Franklin. Pt is in agreement to be continued on risperdal, and he would be open to long-acting injectable as well. Pt is in agreement to continue his current regimen without changes. He had no further questions, comments, or concerns.  Patient remained on the [facility] unit for 7 days. The patient stabilized on medication and therapy. Patient was discharged on Risperidone 2 mg BID, Risperdal Consta 25 mg IM Q14 Days, and Trazodone 50 mg QHS PRN. Patient has shown improvement with improved mood, affect, sleep, appetite, and interaction. Patient has attended group and participated. Patient has been seen in the day room interacting with peers and staff appropriately. Patient denies any SI/HI/AVH and contracts for safety. Patient agrees to follow up at [facility]. Patient is provided with prescriptions for their medications upon discharge.   Physical Findings: AIMS: Facial and Oral Movements Muscles of Facial Expression: None, normal Lips and Perioral Area: None, normal Jaw: None, normal Tongue: None, normal,Extremity Movements Upper (arms, wrists, hands, fingers): None, normal Lower (legs, knees, ankles, toes): None, normal, Trunk Movements Neck, shoulders, hips: None,  normal, Overall Severity Severity of abnormal movements (highest score from questions above): None, normal Incapacitation due to abnormal movements: None, normal Patient's awareness of abnormal movements (rate only patient's report): No Awareness, Dental Status Current problems with teeth and/or dentures?: No Does patient usually wear dentures?: No  CIWA:  CIWA-Ar Total: 2 COWS:  COWS Total Score: 1  Musculoskeletal: Strength & Muscle Tone: within normal limits Gait & Station: normal Patient leans: N/A  Psychiatric Specialty Exam: Physical Exam  Nursing note and vitals reviewed. Constitutional: He is oriented to person, place, and time. He appears well-developed and well-nourished.  Cardiovascular: Normal rate.  Respiratory: Effort normal.  Musculoskeletal: Normal range of motion.  Neurological: He is alert and oriented to person, place, and time.  Skin: Skin is warm.    Review of Systems  Constitutional: Negative.   HENT: Negative.   Eyes: Negative.   Respiratory: Negative.   Cardiovascular: Negative.   Gastrointestinal: Negative.  Genitourinary: Negative.   Musculoskeletal: Negative.   Skin: Negative.   Neurological: Negative.   Endo/Heme/Allergies: Negative.   Psychiatric/Behavioral: Negative.     Blood pressure 104/88, pulse 88, temperature 98.4 F (36.9 C), resp. rate 18, height 5\' 11"  (1.803 m), weight 106.1 kg, SpO2 100 %.Body mass index is 32.64 kg/m.  General Appearance: Casual  Eye Contact:  Good  Speech:  Clear and Coherent and Normal Rate  Volume:  Normal  Mood:  Euthymic  Affect:  Congruent  Thought Process:  Goal Directed and Descriptions of Associations: Intact  Orientation:  Full (Time, Place, and Person)  Thought Content:  WDL  Suicidal Thoughts:  No  Homicidal Thoughts:  No  Memory:  Immediate;   Good Recent;   Good Remote;   Good  Judgement:  Fair  Insight:  Fair  Psychomotor Activity:  Normal  Concentration:  Concentration: Fair and  Attention Span: Fair  Recall:  Good  Fund of Knowledge:  Good  Language:  Good  Akathisia:  No  Handed:  Right  AIMS (if indicated):     Assets:  Communication Skills Desire for Improvement Financial Resources/Insurance Housing Physical Health Social Support Transportation  ADL's:  Intact  Cognition:  WNL  Sleep:  Number of Hours: 0     Have you used any form of tobacco in the last 30 days? (Cigarettes, Smokeless Tobacco, Cigars, and/or Pipes): Yes  Has this patient used any form of tobacco in the last 30 days? (Cigarettes, Smokeless Tobacco, Cigars, and/or Pipes) Yes, Yes, A prescription for an FDA-approved tobacco cessation medication was offered at discharge and the patient refused  Blood Alcohol level:  Lab Results  Component Value Date   ETH <10 04/29/2018    Metabolic Disorder Labs:  No results found for: HGBA1C, MPG No results found for: PROLACTIN Lab Results  Component Value Date   CHOL 160 12/05/2017   TRIG 45 12/05/2017   HDL 38 (L) 12/05/2017   CHOLHDL 4.2 12/05/2017   VLDL 9 12/05/2017   LDLCALC 113 (H) 12/05/2017    See Psychiatric Specialty Exam and Suicide Risk Assessment completed by Attending Physician prior to discharge.  Discharge destination:  Home  Is patient on multiple antipsychotic therapies at discharge:  No   Has Patient had three or more failed trials of antipsychotic monotherapy by history:  No  Recommended Plan for Multiple Antipsychotic Therapies: NA   Allergies as of 05/08/2018      Reactions   Penicillins Hives   .Has patient had a PCN reaction causing immediate rash, facial/tongue/throat swelling, SOB or lightheadedness with hypotension: Yes as patient had a PCN reaction causing severe rash involving mucus membranes or skin necrosis: No Has patient had a PCN reaction that required hospitalization: Yes Has patient had a PCN reaction occurring within the last 10 years: No/ If all of the above answers are "NO", then may proceed  with Cephalosporin use.      Medication List    STOP taking these medications   LORazepam 0.5 MG tablet Commonly known as:  ATIVAN   multivitamin with minerals Tabs tablet   perphenazine 4 MG tablet Commonly known as:  TRILAFON     TAKE these medications     Indication  hydrOXYzine 25 MG tablet Commonly known as:  ATARAX/VISTARIL Take 1 tablet (25 mg total) by mouth 3 (three) times daily as needed for anxiety.  Indication:  Feeling Anxious   risperiDONE 2 MG tablet Commonly known as:  RISPERDAL Take 1 tablet (2 mg  total) by mouth 2 (two) times daily. For mood control  Indication:  mood stability   risperiDONE microspheres 25 MG injection Commonly known as:  RISPERDAL CONSTA Inject 2 mLs (25 mg total) into the muscle every 14 (fourteen) days. Next dose due 05-17-18 Start taking on:  05/17/2018  Indication:  mood stability   traZODone 50 MG tablet Commonly known as:  DESYREL Take 1 tablet (50 mg total) by mouth at bedtime as needed for sleep.  Indication:  Trouble Sleeping      Follow-up Information    [facility], [facility]. Go on 05/09/2018.   Why:  Please attend your appt on Tuesday, 05/09/18, at 10:00am.  Please bring photo ID, social security card, and insurance card. Contact information: [address] [place] [address] [phone]           Follow-up recommendations:  Continue activity as tolerated. Continue diet as recommended by your PCP. Ensure to keep all appointments with outpatient providers.  Comments:  Patient is instructed prior to discharge to: Take all medications as prescribed by his/her mental healthcare provider. Report any adverse effects and or reactions from the medicines to his/her outpatient provider promptly. Patient has been instructed & cautioned: To not engage in alcohol and or illegal drug use while on prescription medicines. In the event of worsening symptoms, patient is instructed to call the crisis hotline, 911 and or go to the  nearest ED for appropriate evaluation and treatment of symptoms. To follow-up with his/her primary care provider for your other medical issues, concerns and or health care needs.    Signed: Gerlene Burdock Money, FNP 05/08/2018, 8:59 AM   Patient seen, Suicide Assessment Completed.  Disposition Plan Reviewed

## 2018-05-08 NOTE — Progress Notes (Signed)
Recreation Therapy Notes   Date: 9.9.19 Time: 1000 Location: 500 Hall Dayroom  Group Topic: Goal Setting  Goal Area(s) Addresses:  Patient will successfully set at least 3 goals for their future during group.  Patient will successfully identify benefit of setting goals.    Behavioral Response: Engaged  Intervention: Worksheet, pencils  Activity: Goal Setting.  Patients were given worksheets that were broken into 6 categories (family, friends, work/school, body, mental health and spirituality).  Patients were to identify what they are doing well, what they want to improve and set a goal of how to make that improvement.  Patients will then share their top 3 goals with the group.    Education Outcome:  Acknowledges education In group clarification offered Needs additional education  Clinical Observations/Feedback: Pt stated for friends, spirituality and mental health that he needs to listen more.  When asked to elaborate, pt stated "I need to be calmer".    Caroll Rancher, LRT/CTRS         Caroll Rancher A 05/08/2018 12:38 PM

## 2018-05-08 NOTE — Progress Notes (Signed)
Recreation Therapy Notes  INPATIENT RECREATION TR PLAN  Patient Details Name: Scott Atkins MRN: [id number] DOB: October 01, 1985 Today's Date: 05/08/2018  Rec Therapy Plan Is patient appropriate for Therapeutic Recreation?: Yes Treatment times per week: 3 times per week Estimated Length of Stay: 5-7 days TR Treatment/Interventions: Group participation (Comment)  Discharge Criteria Pt will be discharged from therapy if:: Discharged Treatment plan/goals/alternatives discussed and agreed upon by:: Patient/family  Discharge Summary Short term goals set: See patient care plan Short term goals met: Adequate for discharge Progress toward goals comments: Groups attended Which groups?: Goal setting Reason goals not met: None Therapeutic equipment acquired: N/A Reason patient discharged from therapy: Discharge from hospital Pt/family agrees with progress & goals achieved: Yes Date patient discharged from therapy: 05/08/18   Victorino Sparrow, LRT/CTRS   Ria Comment, [place] 05/08/2018, 12:22 PM

## 2018-05-08 NOTE — Progress Notes (Signed)
Patient discharged to lobby. Patient was stable and appreciative at that time. All papers, samples and prescriptions were given and valuables returned. Verbal understanding expressed. Denies SI/HI and A/VH. Patient given opportunity to express concerns and ask questions.  

## 2018-05-08 NOTE — Plan of Care (Signed)
Pt attended and participated in one recreation therapy group session.   Scott Atkins, LRT/CTRS 

## 2018-08-10 ENCOUNTER — Ambulatory Visit: Payer: Self-pay | Admitting: Physician Assistant

## 2018-08-17 ENCOUNTER — Ambulatory Visit: Payer: Self-pay | Admitting: Physician Assistant

## 2018-11-23 ENCOUNTER — Encounter: Payer: Self-pay | Admitting: Physician Assistant

## 2018-11-23 ENCOUNTER — Ambulatory Visit: Payer: Self-pay | Admitting: Physician Assistant

## 2018-11-23 DIAGNOSIS — F17200 Nicotine dependence, unspecified, uncomplicated: Secondary | ICD-10-CM

## 2018-11-23 DIAGNOSIS — K0889 Other specified disorders of teeth and supporting structures: Secondary | ICD-10-CM

## 2018-11-23 MED ORDER — CLINDAMYCIN HCL 300 MG PO CAPS: 300.0000 mg | ORAL_CAPSULE | Freq: Three times a day (TID) | ORAL | 0 refills | Status: DC

## 2018-11-23 NOTE — Progress Notes (Signed)
   There were no vitals taken for this visit.   Subjective:    Patient ID: Scott Atkins, male    DOB: 1986/03/19, 33 y.o.   MRN: [id number]  HPI: Scott Atkins is a 33 y.o. male presenting on 11/23/2018 for No chief complaint on file.   HPI   This is a telemedicine visit through [employer] due to the coronavirus pandemic.  Pt says he is doing well.  He is no longer going to Mental Health.  He says he is doing better.    Pt says he has dental abscess started bothering him again   He is still smoking.    He says his hand hurts "every once in a while" as he has been seen for this in the past.  No recent changes.  It is not hurting currently.   Pt denies fever, SOB   Relevant past medical, surgical, family and social history reviewed and updated as indicated. Interim medical history since our last visit reviewed. Allergies and medications reviewed and updated.   CURRENT MEDS: None   Review of Systems  Per HPI unless specifically indicated above     Objective:    There were no vitals taken for this visit.  Wt Readings from Last 3 Encounters:  04/29/18 237 lb (107.5 kg)  02/02/18 244 lb 8 oz (110.9 kg)  01/05/18 244 lb 8 oz (110.9 kg)    Physical Exam HENT:     Head: Normocephalic and atraumatic.     Comments: No swelling of face.  Speech is clear and no drooling.  Pulmonary:     Effort: Pulmonary effort is normal. No respiratory distress.  Neurological:     Mental Status: He is alert and oriented to person, place, and time.  Psychiatric:        Mood and Affect: Mood normal.        Behavior: Behavior normal.     Comments: Pt is speaking normally and appropriately.  He responds to questions and asks questions that he has.           Assessment & Plan:   Encounter Diagnoses  Name Primary?  [name] Yes  . Tobacco use disorder     -rx for clindamycin to [facility] for tooth -pt is put on Dental list -counseled smoking cessation -will Schedule to  RTO on July 1 beause pt is wanting a complet physical.  Pt to call office prior to that time if needed

## 2019-02-15 IMAGING — DX DG HAND COMPLETE 3+V*R*
3 series · 3 of 3 positions shown · non-contrast
Comparison: None.

CLINICAL DATA: Pain and swelling involving the right hand and long
finger. No reported injury.

EXAM:
RIGHT HAND - COMPLETE 3+ VIEW

[hand pa]
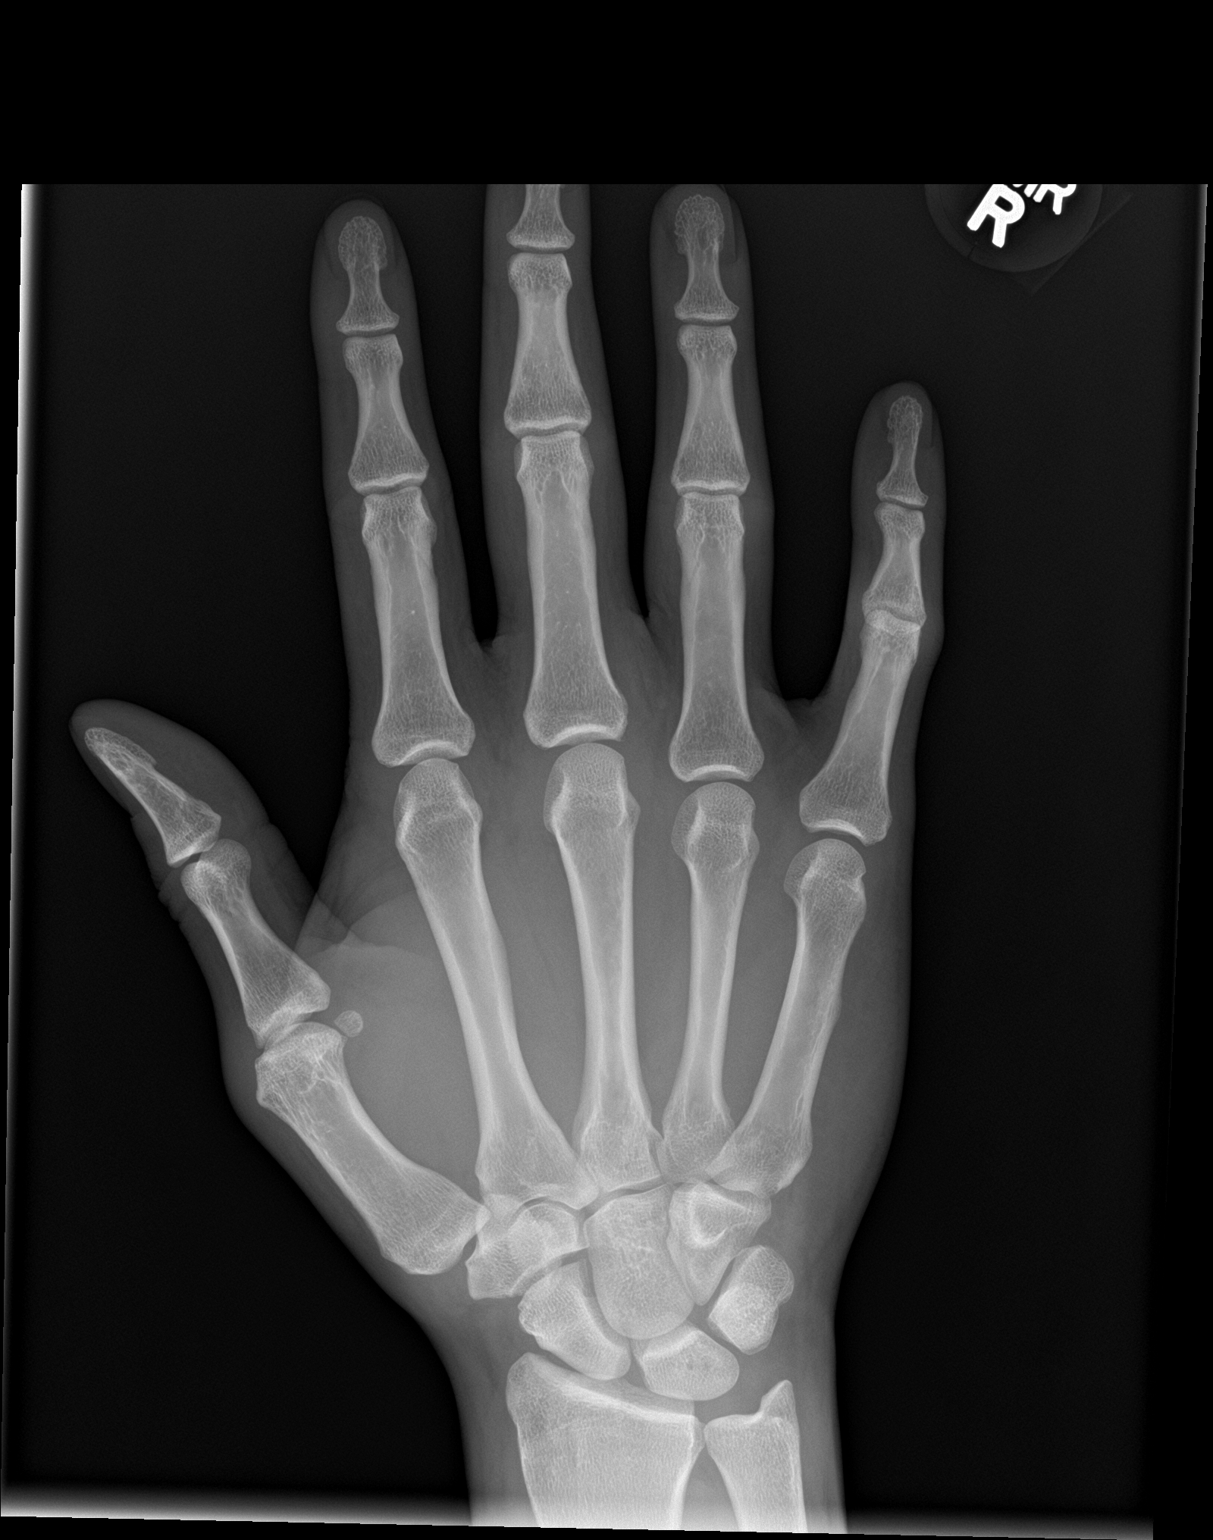

[hand obl]
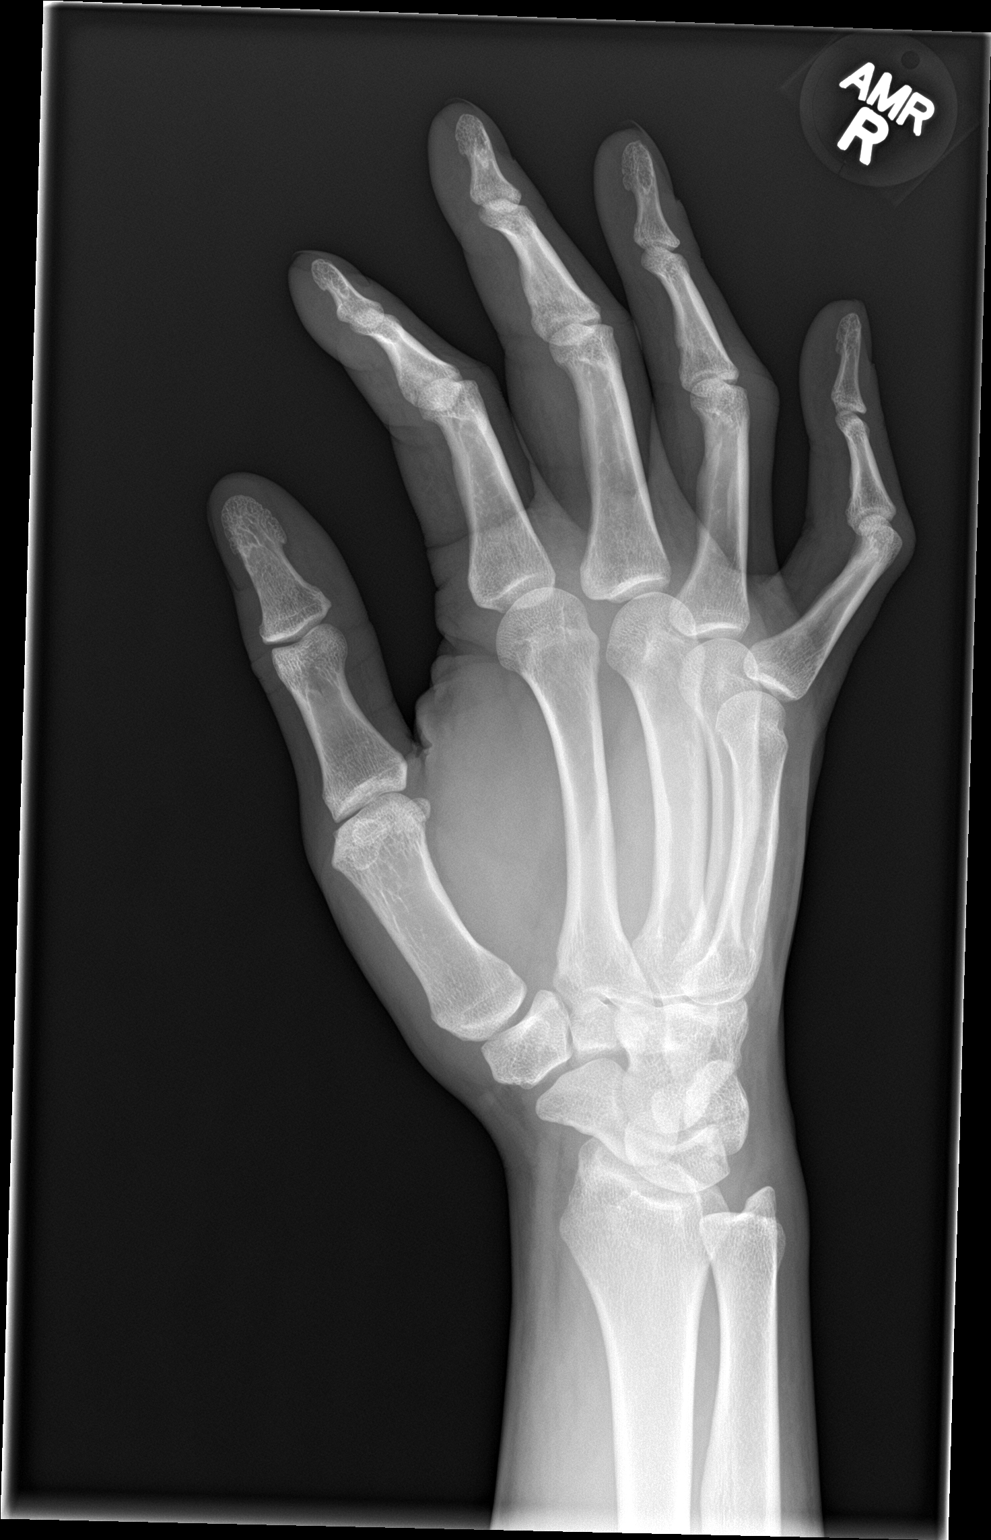

[hand lat]
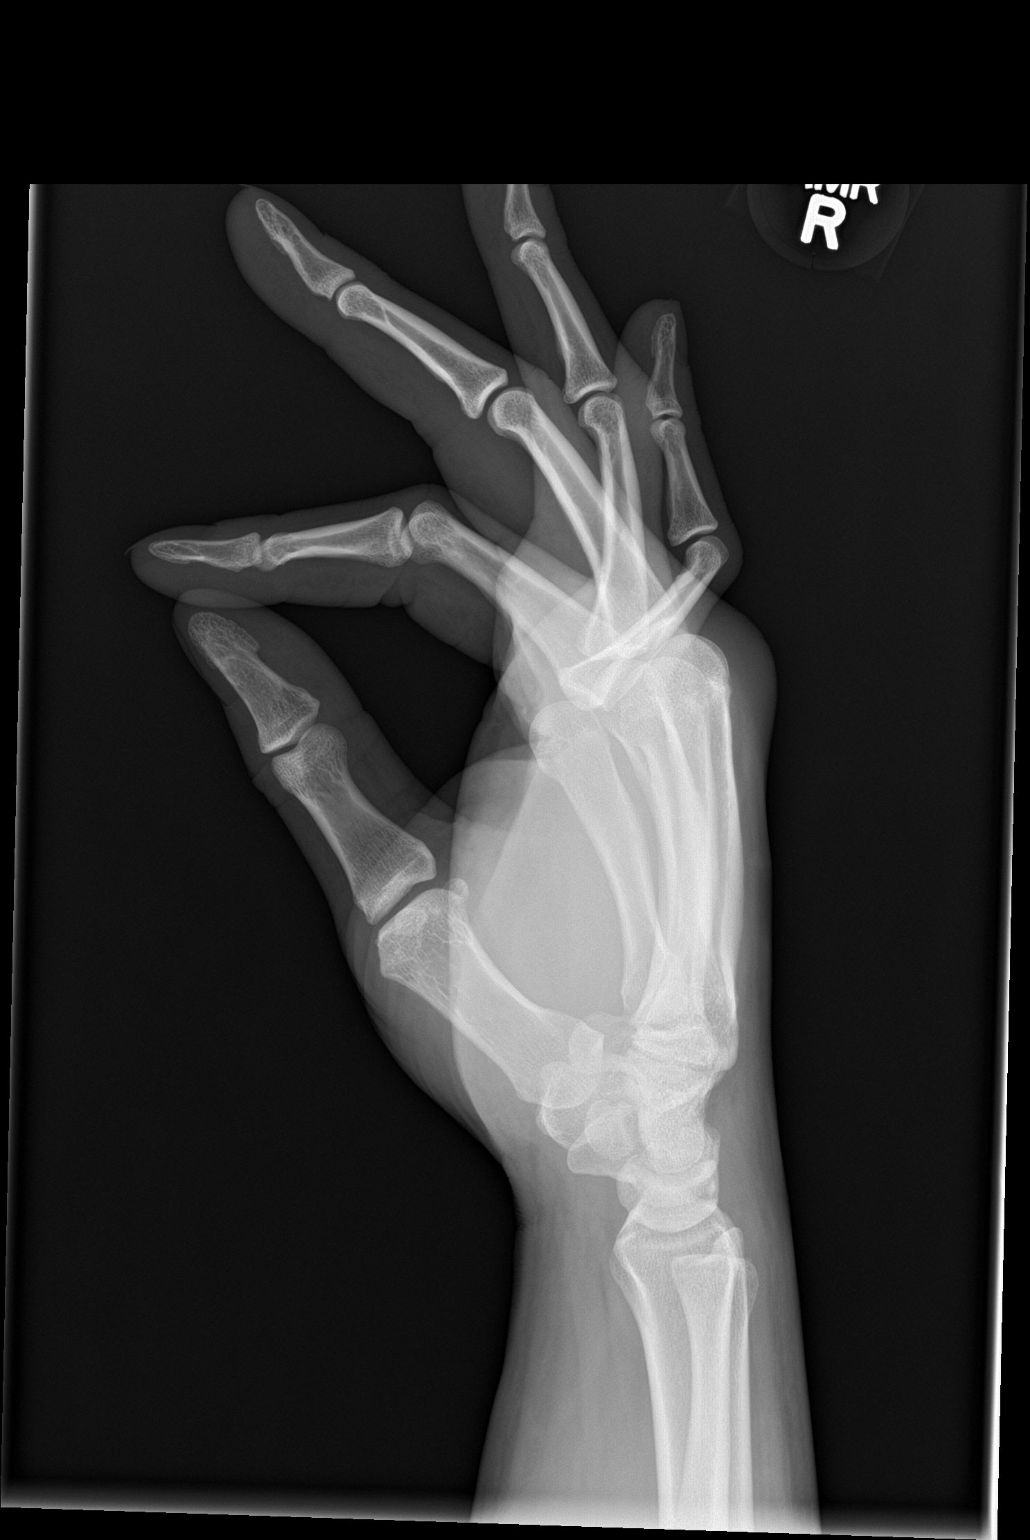

[3 of 3 positions shown; findings below may reference images not displayed]

FINDINGS: There is mild diffuse soft tissue swelling involving the long finger
extending proximally to the distal metacarpal region along the
dorsum of the hand. No fracture, dislocation, osseous erosion, soft
tissue emphysema, or radiopaque foreign body is identified. Joint
space widths are preserved.
IMPRESSION: Soft tissue swelling without acute osseous abnormality identified.

## 2019-02-20 ENCOUNTER — Telehealth: Payer: Self-pay | Admitting: Physician Assistant

## 2019-02-28 ENCOUNTER — Ambulatory Visit: Payer: Self-pay | Admitting: Physician Assistant

## 2019-04-03 ENCOUNTER — Encounter: Payer: Self-pay | Admitting: Physician Assistant

## 2019-04-03 ENCOUNTER — Ambulatory Visit: Payer: Self-pay | Admitting: Physician Assistant

## 2019-04-03 DIAGNOSIS — K0889 Other specified disorders of teeth and supporting structures: Secondary | ICD-10-CM

## 2019-04-03 DIAGNOSIS — F17200 Nicotine dependence, unspecified, uncomplicated: Secondary | ICD-10-CM

## 2019-04-03 DIAGNOSIS — Z0000 Encounter for general adult medical examination without abnormal findings: Secondary | ICD-10-CM

## 2019-04-03 MED ORDER — CLINDAMYCIN HCL 300 MG PO CAPS: 300.0000 mg | ORAL_CAPSULE | Freq: Three times a day (TID) | ORAL | 0 refills | Status: DC

## 2019-04-03 NOTE — Progress Notes (Signed)
   There were no vitals taken for this visit.   Subjective:    Patient ID: Scott Atkins, male    DOB: [birth year], 33 y.o.   MRN: [id number]  HPI: Scott Atkins is a 33 y.o. male presenting on 04/03/2019 for Follow-up   HPI   This is a telemedicine appointment through [employer] due to coronavirus pandemic  I connected with  Scott Atkins on 04/03/19 by a video enabled telemedicine application and verified that I am speaking with the correct person using two identifiers.   I discussed the limitations of evaluation and management by telemedicine. The patient expressed understanding and agreed to proceed.  Pt is at home.  Provider is at office.    Pt says that he exercises every day for an hour on his treadmill.  He says he has no problems with sob or chest pains.     He says his only problem today is abscess on tooth is back.  He was seen for this in March and put on the dental list.  Discussed with pt that dental list has been very slow moving due to the covid 19.   He is not having any swelling of his face.  Pt continues to smoke.    Relevant past medical, surgical, family and social history reviewed and updated as indicated. Interim medical history since our last visit reviewed. Allergies and medications reviewed and updated.   Current Outpatient Medications:  Marland Kitchen  Multiple Vitamin (MULTI-VITAMIN DAILY PO), Take 1 tablet by mouth daily., Disp: , Rfl:     Review of Systems  Per HPI unless specifically indicated above     Objective:    There were no vitals taken for this visit.  Wt Readings from Last 3 Encounters:  04/29/18 237 lb (107.5 kg)  02/02/18 244 lb 8 oz (110.9 kg)  01/05/18 244 lb 8 oz (110.9 kg)    Physical Exam Constitutional:      General: He is not in acute distress.    Appearance: Normal appearance. He is not ill-appearing.  HENT:     Head: Normocephalic and atraumatic.     Comments: No swelling of the face    Mouth/Throat:     Comments: No  trismus Pulmonary:     Effort: Pulmonary effort is normal. No respiratory distress.  Neurological:     Mental Status: He is alert and oriented to person, place, and time.  Psychiatric:        Attention and Perception: Attention normal.        Mood and Affect: Affect is angry.        Speech: Speech normal.        Behavior: Behavior is cooperative.           Assessment & Plan:    Encounter Diagnoses  Name Primary?  . Well adult health check Yes  . Dentalgia   . Tobacco use disorder      -Pt on dental list already.  rx clindamycin sent to [facility].   -pt encouraged to continue regular exercise and healthy diet -encouraged pt on smoking cessation and to wear a mask when he goes out in public -pt to follow up in 1 year.  He is to contact office sooner prn

## 2019-05-03 ENCOUNTER — Telehealth: Payer: Self-pay | Admitting: Physician Assistant

## 2019-05-16 ENCOUNTER — Telehealth: Payer: Self-pay | Admitting: Student

## 2019-05-16 ENCOUNTER — Other Ambulatory Visit: Payer: Self-pay | Admitting: Physician Assistant

## 2019-05-16 MED ORDER — CLINDAMYCIN HCL 300 MG PO CAPS: 300.0000 mg | ORAL_CAPSULE | Freq: Three times a day (TID) | ORAL | 0 refills | Status: AC

## 2019-05-16 NOTE — Telephone Encounter (Signed)
Pt called c/o abcess on R bottom tooth for 2 weeks. Pt states it is swollen and is causing his head to hurt. Pt denies drainage. Pt is requesting antibiotic to be sent to [facility] in [place].  PA will rx this one time. Pt is to be aware that it is not ideal to be on antibiotics for extended periods of time and to be reminded that he was given antibiotic for this one month ago. PA sent rx to [facility] in [place].  LPN notified pt of above and also recommended pt to do warm salt water gargles. Pt verbalized understanding.

## 2020-03-24 ENCOUNTER — Ambulatory Visit: Payer: Self-pay | Admitting: Physician Assistant

## 2020-05-22 ENCOUNTER — Ambulatory Visit: Payer: Self-pay | Admitting: Nurse Practitioner

## 2020-07-07 ENCOUNTER — Encounter: Payer: Self-pay | Admitting: Emergency Medicine

## 2020-07-07 ENCOUNTER — Ambulatory Visit
Admission: EM | Admit: 2020-07-07 | Discharge: 2020-07-07 | Disposition: A | Discharge status: Home / Self Care | Payer: 59 | Attending: Emergency Medicine | Admitting: Emergency Medicine

## 2020-07-07 ENCOUNTER — Other Ambulatory Visit: Payer: Self-pay

## 2020-07-07 DIAGNOSIS — H6001 Abscess of right external ear: Secondary | ICD-10-CM | POA: Diagnosis not present

## 2020-07-07 MED ORDER — DOXYCYCLINE HYCLATE 100 MG PO CAPS: 100.0000 mg | ORAL_CAPSULE | Freq: Two times a day (BID) | ORAL | 0 refills | Status: AC

## 2020-07-07 NOTE — ED Triage Notes (Signed)
Pain and swelling to RT ear lobe x 4-5 days ago.  Also has knot behind ear on the RT side. Pt has been using peroxide on the area.

## 2020-07-07 NOTE — ED Provider Notes (Signed)
[facility]   [id number] 07/07/20 Arrival Time: 1554   [id number]  SUBJECTIVE:  Scott Atkins is a 34 y.o. male who presents with a possible abscess of his RT earlobe x 1 week. Denies precipitating event or trauma.  Has tried OTC medications with relief.  Sore to the touch.  Denies similar symptoms.  Denies fever, chills, nausea, vomiting.    ROS: As per HPI.  All other pertinent ROS negative.     Past Medical History:  Diagnosis Date  . Mood disorder (HCC)    pt says he was never told dx by psychiatrist   Past Surgical History:  Procedure Laterality Date  . TYMPANOSTOMY TUBE PLACEMENT     Allergies  Allergen Reactions  . Penicillins Hives    .Has patient had a PCN reaction causing immediate rash, facial/tongue/throat swelling, SOB or lightheadedness with hypotension: Yes as patient had a PCN reaction causing severe rash involving mucus membranes or skin necrosis: No Has patient had a PCN reaction that required hospitalization: Yes Has patient had a PCN reaction occurring within the last 10 years: No/ If all of the above answers are "NO", then may proceed with Cephalosporin use.    No current facility-administered medications on file prior to encounter.   Current Outpatient Medications on File Prior to Encounter  Medication Sig Dispense Refill  . clindamycin (CLEOCIN) 300 MG capsule Take 1 capsule (300 mg total) by mouth 3 (three) times daily. 21 capsule 0  . Multiple Vitamin (MULTI-VITAMIN DAILY PO) Take 1 tablet by mouth daily.     Social History   Socioeconomic History  . Marital status: Single    Spouse name: Not on file  . Number of children: Not on file  . Years of education: Not on file  . Highest education level: Not on file  Occupational History  . Not on file  Tobacco Use  . Smoking status: Current Every Day Smoker    Packs/day: 0.00    Years: 12.00    Pack years: 0.00    Types: Cigarettes  . Smokeless tobacco: Never Used  . Tobacco  comment: 1 cig a day  Substance and Sexual Activity  . Alcohol use: Yes    Comment: "drinks rarely"  . Drug use: Not Currently    Types: Marijuana    Comment: occassionally  . Sexual activity: Not on file  Other Topics Concern  . Not on file  Social History Narrative  . Not on file   Social Determinants of Health   Financial Resource Strain:   . Difficulty of Paying Living Expenses: Not on file  Food Insecurity:   . Worried About Programme researcher, broadcasting/film/video in the Last Year: Not on file  . Ran Out of Food in the Last Year: Not on file  Transportation Needs:   . Lack of Transportation (Medical): Not on file  . Lack of Transportation (Non-Medical): Not on file  Physical Activity:   . Days of Exercise per Week: Not on file  . Minutes of Exercise per Session: Not on file  Stress:   . Feeling of Stress : Not on file  Social Connections:   . Frequency of Communication with Friends and Family: Not on file  . Frequency of Social Gatherings with Friends and Family: Not on file  . Attends Religious Services: Not on file  . Active Member of Clubs or Organizations: Not on file  . Attends Banker Meetings: Not on file  . Marital Status: Not on  file  Intimate Partner Violence:   . Fear of Current or Ex-Partner: Not on file  . Emotionally Abused: Not on file  . Physically Abused: Not on file  . Sexually Abused: Not on file   Family History  Problem Relation Age of Onset  . Heart attack Maternal Grandfather     OBJECTIVE:  Vitals:   07/07/20 1611  BP: 125/81  Pulse: 84  Resp: 18  Temp: 98.9 F (37.2 C)  TempSrc: Oral  SpO2: 96%  Weight: 235 lb (106.6 kg)  Height: 5\' 11"  (1.803 m)     General appearance: alert; no distress ENT: EAC clear, TM pearly gray; no mastoid erythema Skin: 1 cm induration of his RT ear lobe with overlying erythema; tender to touch; no active drainage, pustule on posterior aspect of RT ear lobe Psychological: alert and cooperative; normal  mood and affect  ASSESSMENT & PLAN:  1. Abscess of external ear, right     Meds ordered this encounter  Medications  . doxycycline (VIBRAMYCIN) 100 MG capsule    Sig: Take 1 capsule (100 mg total) by mouth 2 (two) times daily.    Dispense:  20 capsule    Refill:  0    Order Specific Question:   Supervising Provider    Answer:   Eustace Moore   Apply warm compresses 3-4x daily for 10-15 minutes Wash site daily with warm water and mild soap Keep covered to avoid friction Take antibiotic as prescribed and to completion Follow up here or with PCP if symptoms persists Return or go to the ED if you have any new or worsening symptoms increased redness, swelling, pain, nausea, vomiting, fever, chills, etc...   Reviewed expectations re: course of current medical issues. Questions answered. Outlined signs and symptoms indicating need for more acute intervention. Patient verbalized understanding. After Visit Summary given.          [[id number]], PA-C 07/07/20 1638

## 2020-07-07 NOTE — Discharge Instructions (Signed)
Apply warm compresses 3-4x daily for 10-15 minutes Wash site daily with warm water and mild soap Keep covered to avoid friction Take antibiotic as prescribed and to completion Follow up here or with PCP if symptoms persists Return or go to the ED if you have any new or worsening symptoms increased redness, swelling, pain, nausea, vomiting, fever, chills, etc...
